# Patient Record
Sex: Female | Born: 1995 | Hispanic: Yes | Smoking: Never smoker
Health system: Southern US, Community
[De-identification: ages and names within clinical notes are randomized; demographics above are authoritative.]

## PROBLEM LIST (undated history)

## (undated) ENCOUNTER — Inpatient Hospital Stay (HOSPITAL_COMMUNITY): Payer: Self-pay

## (undated) DIAGNOSIS — B009 Herpesviral infection, unspecified: Secondary | ICD-10-CM

## (undated) DIAGNOSIS — N12 Tubulo-interstitial nephritis, not specified as acute or chronic: Secondary | ICD-10-CM

## (undated) DIAGNOSIS — K219 Gastro-esophageal reflux disease without esophagitis: Secondary | ICD-10-CM

## (undated) DIAGNOSIS — Z789 Other specified health status: Secondary | ICD-10-CM

## (undated) HISTORY — DX: Gastro-esophageal reflux disease without esophagitis: K21.9

---

## 2017-11-16 ENCOUNTER — Ambulatory Visit (INDEPENDENT_AMBULATORY_CARE_PROVIDER_SITE_OTHER): Payer: Self-pay | Admitting: Emergency Medicine

## 2017-11-16 ENCOUNTER — Encounter: Payer: Self-pay | Admitting: Emergency Medicine

## 2017-11-16 ENCOUNTER — Other Ambulatory Visit: Payer: Self-pay

## 2017-11-16 VITALS — BP 96/60 | HR 97 | Temp 99.5°F | Resp 16 | Ht 62.25 in | Wt 158.0 lb

## 2017-11-16 DIAGNOSIS — M545 Low back pain, unspecified: Secondary | ICD-10-CM

## 2017-11-16 DIAGNOSIS — R109 Unspecified abdominal pain: Secondary | ICD-10-CM | POA: Insufficient documentation

## 2017-11-16 LAB — POCT URINALYSIS DIP (MANUAL ENTRY)
BILIRUBIN UA: NEGATIVE
GLUCOSE UA: NEGATIVE mg/dL
Ketones, POC UA: NEGATIVE mg/dL
Leukocytes, UA: NEGATIVE
NITRITE UA: NEGATIVE
PH UA: 7 (ref 5.0–8.0)
Protein Ur, POC: NEGATIVE mg/dL
SPEC GRAV UA: 1.01 (ref 1.010–1.025)
UROBILINOGEN UA: 0.2 U/dL

## 2017-11-16 NOTE — Patient Instructions (Addendum)
   IF you received an x-ray today, you will receive an invoice from [facility]. Please contact [facility] at [phone] with questions or concerns regarding your invoice.   IF you received labwork today, you will receive an invoice from [facility]. Please contact [facility] at [phone] with questions or concerns regarding your invoice.   Our billing staff will not be able to assist you with questions regarding bills from these companies.  You will be contacted with the lab results as soon as they are available. The fastest way to get your results is to activate your My Chart account. Instructions are located on the last page of this paperwork. If you have not heard from us regarding the results in 2 weeks, please contact this office.     Dolor de espalda en adultos (Back Pain, Adult) El dolor de espalda es muy frecuente. A menudo mejora con el tiempo. La causa del dolor de espalda generalmente no es peligrosa. La mayora de las personas puede aprender a manejar el dolor de espalda por s mismas. CUIDADOS EN EL HOGAR Controle su dolor de espalda a fin de detectar algn cambio. Las siguientes indicaciones ayudarn a aliviar cualquier dolor que pueda sentir:  Mantngase activo. Comience con caminatas cortas sobre superficies planas si es posible. Trate de caminar un poco ms cada da.  Haga ejercicios con regularidad tal como le indic el mdico. El ejercicio ayuda a que su espalda se cure ms rpidamente. Tambin ayuda a prevenir futuras lesiones al mantener los msculos fuertes y flexibles.  No se siente, conduzca ni permanezca de pie durante ms de 30 minutos.  No permanezca en la cama. Si hace reposo ms de 1 a 2 das, puede demorar su recuperacin.  Sea cuidadoso al inclinarse o levantar un objeto. Use una tcnica apropiada para levantar peso: ? Flexione las rodillas. ? Mantenga el objeto cerca del cuerpo. ? No gire.  Duerma sobre un colchn firme. Recustese  sobre un costado y flexione las rodillas. Si se recuesta sobre la espalda, coloque una almohada debajo de las rodillas.  Tome los medicamentos solamente como se lo haya indicado el mdico.  Aplique hielo sobre la zona lesionada. ? Ponga el hielo en una bolsa plstica. ? Coloque una toalla entre la piel y la bolsa de hielo. ? Deje el hielo durante 20minutos, 2 a 3veces por da, durante los primeros 2 o 3das. Despus de eso, puede alternar entre compresas de hielo y calor.  Evite sentir ansiedad o estrs. Encuentre maneras efectivas de lidiar con el estrs, como hacer ejercicio.  Mantenga un peso saludable. El peso excesivo ejerce tensin sobre la espalda. SOLICITE AYUDA SI:  Siente dolor que no se alivia con reposo o medicamentos.  Siente cada vez ms dolor que se extiende a las piernas o los glteos.  El dolor no mejora en una semana.  Siente dolor por la noche.  Pierde peso.  Siente escalofros o fiebre. SOLICITE AYUDA DE INMEDIATO SI:  No puede controlar su materia fecal (heces) o el pis (orina).  Siente debilidad en las piernas o los brazos.  Siente prdida de la sensibilidad (adormecimiento) en las piernas o los brazos.  Tiene malestar estomacal (nuseas) o vomita.  Siente dolor de estmago (abdominal).  Siente que se desvanece (se desmaya). Esta informacin no tiene como fin reemplazar el consejo del mdico. Asegrese de hacerle al mdico cualquier pregunta que tenga. Document Released: 06/20/2011 Document Revised: 12/26/2014 Document Reviewed: 04/08/2014 Elsevier Interactive Patient Education  2018 Elsevier Inc.    Back Pain, Adult Back pain is very common. The pain often gets better over time. The cause of back pain is usually not dangerous. Most people can learn to manage their back pain on their own. Follow these instructions at home: Watch your back pain for any changes. The following actions may help to lessen any pain you are feeling:  Stay active. Start  with short walks on flat ground if you can. Try to walk farther each day.  Exercise regularly as told by your doctor. Exercise helps your back heal faster. It also helps avoid future injury by keeping your muscles strong and flexible.  Do not sit, drive, or stand in one place for more than 30 minutes.  Do not stay in bed. Resting more than 1-2 days can slow down your recovery.  Be careful when you bend or lift an object. Use good form when lifting: ? Bend at your knees. ? Keep the object close to your body. ? Do not twist.  Sleep on a firm mattress. Lie on your side, and bend your knees. If you lie on your back, put a pillow under your knees.  Take medicines only as told by your doctor.  Put ice on the injured area. ? Put ice in a plastic bag. ? Place a towel between your skin and the bag. ? Leave the ice on for 20 minutes, 2-3 times a day for the first 2-3 days. After that, you can switch between ice and heat packs.  Avoid feeling anxious or stressed. Find good ways to deal with stress, such as exercise.  Maintain a healthy weight. Extra weight puts stress on your back.  Contact a doctor if:  You have pain that does not go away with rest or medicine.  You have worsening pain that goes down into your legs or buttocks.  You have pain that does not get better in one week.  You have pain at night.  You lose weight.  You have a fever or chills. Get help right away if:  You cannot control when you poop (bowel movement) or pee (urinate).  Your arms or legs feel weak.  Your arms or legs lose feeling (numbness).  You feel sick to your stomach (nauseous) or throw up (vomit).  You have belly (abdominal) pain.  You feel like you may pass out (faint). This information is not intended to replace advice given to you by your health care provider. Make sure you discuss any questions you have with your health care provider. Document Released: 05/23/2008 Document Revised:  05/12/2016 Document Reviewed: 04/08/2014 Elsevier Interactive Patient Education  [employer]2018 Elsevier Inc.

## 2017-11-16 NOTE — Progress Notes (Signed)
Kathleen Frey 21 y.o.   Chief Complaint  Patient presents with  . Back Pain    lower area with abdominal pain x 4 days    HISTORY OF PRESENT ILLNESS: This is a 21 y.o. female complaining of pain to right flank area x 4 days; urine smells; denies dysuria but has frequency; years ago had similar symptoms and diagnosed with kidney infection. Denies fever, chills, n/v, or hematuria.  HPI   Prior to Admission medications   Medication Sig Start Date End Date Taking? Authorizing Provider  fluconazole (DIFLUCAN) 150 MG tablet Take 150 mg by mouth daily.   Yes [provider]  metroNIDAZOLE (FLAGYL) 500 MG tablet Take 500 mg by mouth 2 (two) times daily.   Yes [provider]    No Known Allergies  There are no active problems to display for this patient.   No past medical history on file.    Social History   Socioeconomic History  . Marital status: Married    Spouse name: Not on file  . Number of children: Not on file  . Years of education: Not on file  . Highest education level: Not on file  Social Needs  . Financial resource strain: Not on file  . Food insecurity - worry: Not on file  . Food insecurity - inability: Not on file  . Transportation needs - medical: Not on file  . Transportation needs - non-medical: Not on file  Occupational History  . Not on file  Tobacco Use  . Smoking status: Never Smoker  . Smokeless tobacco: Never Used  Substance and Sexual Activity  . Alcohol use: No    Frequency: Never  . Drug use: No  . Sexual activity: Not on file  Other Topics Concern  . Not on file  Social History Narrative  . Not on file    No family history on file.   Review of Systems  Constitutional: Negative.  Negative for chills.  HENT: Negative.   Eyes: Negative.   Respiratory: Negative.  Negative for cough and shortness of breath.   Cardiovascular: Negative for chest pain and palpitations.  Gastrointestinal: Positive for  abdominal pain. Negative for blood in stool, constipation, diarrhea, nausea and vomiting.  Genitourinary: Positive for flank pain and frequency. Negative for dysuria and hematuria.  Musculoskeletal: Positive for back pain.  Skin: Negative for rash.  Neurological: Negative for dizziness and headaches.  Endo/Heme/Allergies: Negative.   All other systems reviewed and are negative.    Vitals:   11/16/17 1633  BP: 96/60  Pulse: 97  Resp: 16  Temp: 99.5 F (37.5 C)  SpO2: 97%   Results for orders placed or performed in visit on 11/16/17 (from the past 24 hour(s))  POCT urinalysis dipstick     Status: Abnormal   Collection Time: 11/16/17  5:49 PM  Result Value Ref Range   Color, UA yellow yellow   Clarity, UA clear clear   Glucose, UA negative negative mg/dL   Bilirubin, UA negative negative   Ketones, POC UA negative negative mg/dL   Spec Grav, UA [id number] - 1.025   Blood, UA small (A) negative   pH, UA 7.0 5.0 - 8.0   Protein Ur, POC negative negative mg/dL   Urobilinogen, UA 0.2 0.2 or 1.0 E.U./dL   Nitrite, UA Negative Negative   Leukocytes, UA Negative Negative     Physical Exam  Constitutional: She is oriented to person, place, and time. She appears well-developed and well-nourished.  HENT:  Head: Normocephalic and atraumatic.  Eyes: Conjunctivae and EOM are normal. Pupils are equal, round, and reactive to light.  Neck: Normal range of motion. Neck supple.  Cardiovascular: Normal rate, regular rhythm and normal heart sounds.  Pulmonary/Chest: Effort normal and breath sounds normal.  Abdominal: Soft. Bowel sounds are normal. She exhibits no distension. There is no tenderness. There is no CVA tenderness.  Neurological: She is alert and oriented to person, place, and time. No sensory deficit. She exhibits normal muscle tone.  Skin: Skin is warm and dry. Capillary refill takes less than 2 seconds. No rash noted.  Psychiatric: She has a normal mood and affect. Her  behavior is normal.  Vitals reviewed.    ASSESSMENT & PLAN: Aylissa was seen today for back pain.  Diagnoses and all orders for this visit:  Acute right-sided low back pain without sciatica -     POCT urinalysis dipstick -     Urine Culture  Flank pain    Patient Instructions       IF you received an x-ray today, you will receive an invoice from [facility]. Please contact [facility] at [phone] with questions or concerns regarding your invoice.   IF you received labwork today, you will receive an invoice from [facility]. Please contact [facility] at [phone] with questions or concerns regarding your invoice.   Our billing staff will not be able to assist you with questions regarding bills from these companies.  You will be contacted with the lab results as soon as they are available. The fastest way to get your results is to activate your My Chart account. Instructions are located on the last page of this paperwork. If you have not heard from [place] regarding the results in 2 weeks, please contact this office.     Dolor de espalda en adultos (Back Pain, Adult) El dolor de espalda es muy frecuente. A menudo mejora con el tiempo. La causa del dolor de espalda generalmente no es peligrosa. La [employer] de las personas puede aprender a Runner, broadcasting/film/video de espalda por s mismas. CUIDADOS EN EL HOGAR Controle su dolor de espalda a fin de Public house manager cambio. Las siguientes indicaciones ayudarn a Psychologist, clinical que pueda sentir:  Materials engineer. Comience con caminatas cortas sobre superficies planas si es posible. Trate de caminar un poco ms cada da.  Haga ejercicios con regularidad tal como le indic el mdico. El ejercicio ayuda a que su espalda se cure ms rpidamente. Tambin ayuda a prevenir futuras lesiones al [employer] fuertes y flexibles.  No se siente, conduzca ni permanezca de pie durante ms de 30 minutos.  No permanezca  en la cama. Si hace reposo ms de 1 a 2 das, puede demorar su recuperacin.  Sea cuidadoso al inclinarse o levantar un objeto. Use una tcnica apropiada para levantar peso: ? Flexione las rodillas. ? Mantenga el objeto cerca del cuerpo. ? No gire.  Duerma sobre un [employer]. Recustese sobre un costado y flexione las rodillas. Si se recuesta [employer], coloque una almohada debajo de las rodillas.  Tome los medicamentos solamente como se lo haya indicado el mdico.  Aplique hielo sobre la zona lesionada. ? Ponga el hielo en una bolsa plstica. ? Coloque una [employer] piel y la bolsa de hielo. ? Deje el hielo durante , 2 a 3veces por da, durante los primeros 2 o 3das. Despus de eso, puede alternar entre compresas de hielo y Airline pilot.  Evite sentir ansiedad  o estrs. Encuentre maneras efectivas de lidiar con el estrs, Surveyor, mining ejercicio.  Mantenga un peso saludable. El peso excesivo ejerce tensin sobre la espalda. SOLICITE AYUDA SI:  Siente dolor que no se alivia con reposo o medicamentos.  Siente cada vez ms dolor que se extiende a las piernas o los glteos.  El dolor no mejora en una semana.  Siente dolor por la noche.  Pierde peso.  Siente escalofros o fiebre. SOLICITE AYUDA DE INMEDIATO SI:  No puede controlar su materia fecal (heces) o el pis (orina).  Siente debilidad en las piernas o los brazos.  Siente prdida de la sensibilidad (adormecimiento) en las piernas o los brazos.  Tiene malestar estomacal (nuseas) o vomita.  Siente dolor de estmago (abdominal).  Siente que se desvanece (se desmaya). Esta informacin no tiene Theme park manager el consejo del mdico. Asegrese de hacerle al mdico cualquier pregunta que tenga. Document Released: 06/20/2011 Document Revised: 12/26/2014 Document Reviewed: 04/08/2014 Elsevier Interactive Patient Education  2018 Elsevier Inc.  Back Pain, Adult Back pain is very common. The pain often  gets better over time. The cause of back pain is usually not dangerous. Most people can learn to manage their back pain on their own. Follow these instructions at home: Watch your back pain for any changes. The following actions may help to lessen any pain you are feeling:  Stay active. Start with short walks on flat ground if you can. Try to walk farther each day.  Exercise regularly as told by your doctor. Exercise helps your back heal faster. It also helps avoid future injury by keeping your muscles strong and flexible.  Do not sit, drive, or stand in one place for more than 30 minutes.  Do not stay in bed. Resting more than 1-2 days can slow down your recovery.  Be careful when you bend or lift an object. Use good form when lifting: ? Bend at your knees. ? Keep the object close to your body. ? Do not twist.  Sleep on a firm mattress. Lie on your side, and bend your knees. If you lie on your back, put a pillow under your knees.  Take medicines only as told by your doctor.  Put ice on the injured area. ? Put ice in a plastic bag. ? Place a towel between your skin and the bag. ? Leave the ice on for 20 minutes, 2-3 times a day for the first 2-3 days. After that, you can switch between ice and heat packs.  Avoid feeling anxious or stressed. Find good ways to deal with stress, such as exercise.  Maintain a healthy weight. Extra weight puts stress on your back.  Contact a doctor if:  You have pain that does not go away with rest or medicine.  You have worsening pain that goes down into your legs or buttocks.  You have pain that does not get better in one week.  You have pain at night.  You lose weight.  You have a fever or chills. Get help right away if:  You cannot control when you poop (bowel movement) or pee (urinate).  Your arms or legs feel weak.  Your arms or legs lose feeling (numbness).  You feel sick to your stomach (nauseous) or throw up (vomit).  You have  belly (abdominal) pain.  You feel like you may pass out (faint). This information is not intended to replace advice given to you by your health care provider. Make sure you discuss any questions  you have with your health care provider. Document Released: 05/23/2008 Document Revised: 05/12/2016 Document Reviewed: 04/08/2014 Elsevier Interactive Patient Education  2018 Elsevier Inc.      Edwina BarthMiguel Kate Larock, MD [facility]

## 2017-11-17 LAB — URINE CULTURE

## 2017-11-19 ENCOUNTER — Emergency Department (HOSPITAL_COMMUNITY)
Admission: EM | Admit: 2017-11-19 | Discharge: 2017-11-19 | Disposition: A | Discharge status: Home / Self Care | Payer: Self-pay | Attending: Emergency Medicine | Admitting: Emergency Medicine

## 2017-11-19 ENCOUNTER — Emergency Department (HOSPITAL_COMMUNITY): Payer: Self-pay

## 2017-11-19 ENCOUNTER — Other Ambulatory Visit: Payer: Self-pay

## 2017-11-19 ENCOUNTER — Encounter (HOSPITAL_COMMUNITY): Payer: Self-pay | Admitting: Emergency Medicine

## 2017-11-19 DIAGNOSIS — R1031 Right lower quadrant pain: Secondary | ICD-10-CM | POA: Insufficient documentation

## 2017-11-19 DIAGNOSIS — R1011 Right upper quadrant pain: Secondary | ICD-10-CM | POA: Insufficient documentation

## 2017-11-19 DIAGNOSIS — M791 Myalgia, unspecified site: Secondary | ICD-10-CM | POA: Insufficient documentation

## 2017-11-19 DIAGNOSIS — M545 Low back pain: Secondary | ICD-10-CM | POA: Insufficient documentation

## 2017-11-19 DIAGNOSIS — R10813 Right lower quadrant abdominal tenderness: Secondary | ICD-10-CM | POA: Insufficient documentation

## 2017-11-19 DIAGNOSIS — R110 Nausea: Secondary | ICD-10-CM | POA: Insufficient documentation

## 2017-11-19 DIAGNOSIS — M542 Cervicalgia: Secondary | ICD-10-CM | POA: Insufficient documentation

## 2017-11-19 DIAGNOSIS — N12 Tubulo-interstitial nephritis, not specified as acute or chronic: Secondary | ICD-10-CM | POA: Insufficient documentation

## 2017-11-19 DIAGNOSIS — R51 Headache: Secondary | ICD-10-CM | POA: Insufficient documentation

## 2017-11-19 DIAGNOSIS — R10811 Right upper quadrant abdominal tenderness: Secondary | ICD-10-CM | POA: Insufficient documentation

## 2017-11-19 DIAGNOSIS — Z79899 Other long term (current) drug therapy: Secondary | ICD-10-CM | POA: Insufficient documentation

## 2017-11-19 LAB — URINALYSIS, ROUTINE W REFLEX MICROSCOPIC
Bilirubin Urine: NEGATIVE
Glucose, UA: NEGATIVE mg/dL
Ketones, ur: 5 mg/dL — AB
Nitrite: NEGATIVE
PH: 9 — AB (ref 5.0–8.0)
Protein, ur: NEGATIVE mg/dL
Specific Gravity, Urine: 1.009 (ref 1.005–1.030)

## 2017-11-19 LAB — COMPREHENSIVE METABOLIC PANEL
ALK PHOS: 77 U/L (ref 38–126)
ALT: 14 U/L (ref 14–54)
ANION GAP: 9 (ref 5–15)
AST: 17 U/L (ref 15–41)
Albumin: 3.5 g/dL (ref 3.5–5.0)
BILIRUBIN TOTAL: 0.6 mg/dL (ref 0.3–1.2)
BUN: 6 mg/dL (ref 6–20)
CALCIUM: 9 mg/dL (ref 8.9–10.3)
CO2: 24 mmol/L (ref 22–32)
Chloride: 105 mmol/L (ref 101–111)
Creatinine, Ser: 0.66 mg/dL (ref 0.44–1.00)
GLUCOSE: 92 mg/dL (ref 65–99)
POTASSIUM: 3.9 mmol/L (ref 3.5–5.1)
Sodium: 138 mmol/L (ref 135–145)
TOTAL PROTEIN: 7.5 g/dL (ref 6.5–8.1)

## 2017-11-19 LAB — CBC WITH DIFFERENTIAL/PLATELET
BASOS ABS: 0 10*3/uL (ref 0.0–0.1)
BASOS PCT: 0 %
EOS ABS: 0 10*3/uL (ref 0.0–0.7)
Eosinophils Relative: 0 %
HEMATOCRIT: 38.4 % (ref 36.0–46.0)
HEMOGLOBIN: 12.8 g/dL (ref 12.0–15.0)
Lymphocytes Relative: 7 %
Lymphs Abs: 1.3 10*3/uL (ref 0.7–4.0)
MCH: 28.9 pg (ref 26.0–34.0)
MCHC: 33.3 g/dL (ref 30.0–36.0)
MCV: 86.7 fL (ref 78.0–100.0)
MONOS PCT: 3 %
Monocytes Absolute: 0.6 10*3/uL (ref 0.1–1.0)
NEUTROS ABS: 15.7 10*3/uL — AB (ref 1.7–7.7)
NEUTROS PCT: 90 %
Platelets: 234 10*3/uL (ref 150–400)
RBC: 4.43 MIL/uL (ref 3.87–5.11)
RDW: 11.7 % (ref 11.5–15.5)
WBC: 17.6 10*3/uL — ABNORMAL HIGH (ref 4.0–10.5)

## 2017-11-19 LAB — PROTIME-INR
INR: 1.15
PROTHROMBIN TIME: 14.6 s (ref 11.4–15.2)

## 2017-11-19 LAB — I-STAT BETA HCG BLOOD, ED (MC, WL, AP ONLY): I-stat hCG, quantitative: <5 m[IU]/mL (ref <5)

## 2017-11-19 LAB — I-STAT CG4 LACTIC ACID, ED: LACTIC ACID, VENOUS: 1.06 mmol/L (ref 0.5–1.9)

## 2017-11-19 MED ORDER — TRAMADOL HCL 50 MG PO TABS: 50.0000 mg | ORAL_TABLET | Freq: Four times a day (QID) | ORAL | 0 refills | Status: DC | PRN

## 2017-11-19 MED ORDER — ONDANSETRON 8 MG PO TBDP: 8.0000 mg | ORAL_TABLET | Freq: Three times a day (TID) | ORAL | 0 refills | Status: DC | PRN

## 2017-11-19 MED ORDER — ACETAMINOPHEN 325 MG PO TABS
650.0000 mg | ORAL_TABLET | Freq: Once | ORAL | Status: AC | PRN
  Administered 2017-11-19: 650 mg via ORAL
  Filled 2017-11-19: qty 2

## 2017-11-19 MED ORDER — DEXTROSE 5 % IV SOLN
1.0000 g | Freq: Once | INTRAVENOUS | Status: AC
  Administered 2017-11-19: 1 g via INTRAVENOUS
  Filled 2017-11-19: qty 10

## 2017-11-19 MED ORDER — CEPHALEXIN 500 MG PO CAPS: 500.0000 mg | ORAL_CAPSULE | Freq: Four times a day (QID) | ORAL | 0 refills | Status: AC

## 2017-11-19 MED ORDER — IOPAMIDOL (ISOVUE-300) INJECTION 61%
INTRAVENOUS | Status: AC
  Administered 2017-11-19: 100 mL
  Filled 2017-11-19: qty 100

## 2017-11-19 MED ORDER — SODIUM CHLORIDE 0.9 % IV BOLUS (SEPSIS)
1000.0000 mL | Freq: Once | INTRAVENOUS | Status: AC
  Administered 2017-11-19: 1000 mL via INTRAVENOUS

## 2017-11-19 MED ORDER — IBUPROFEN 600 MG PO TABS: 600.0000 mg | ORAL_TABLET | Freq: Four times a day (QID) | ORAL | 0 refills | Status: DC | PRN

## 2017-11-19 MED ORDER — ONDANSETRON HCL 4 MG/2ML IJ SOLN
4.0000 mg | Freq: Once | INTRAMUSCULAR | Status: AC
  Administered 2017-11-19: 4 mg via INTRAVENOUS
  Filled 2017-11-19: qty 2

## 2017-11-19 MED ORDER — MORPHINE SULFATE (PF) 4 MG/ML IV SOLN
4.0000 mg | Freq: Once | INTRAVENOUS | Status: AC
  Administered 2017-11-19: 4 mg via INTRAVENOUS
  Filled 2017-11-19: qty 1

## 2017-11-19 NOTE — Discharge Instructions (Signed)
You have evidence of a kidney infection called pyelonephritis. Hydration: Please be sure to drink plenty of water to stay well hydrated.  This includes having a goal of about half a liter of water an hour. Antiinflammatory medications: Take 600 mg of ibuprofen every 6 hours or 440 mg (over the counter dose) to 500 mg (prescription dose) of naproxen every 12 hours for the next 3 days. After this time, these medications may be used as needed for pain. Take these medications with food to avoid upset stomach. Choose only one of these medications, do not take them together. Tylenol: Should you continue to have additional pain while taking the ibuprofen or naproxen, you may add in tylenol as needed. Your daily total maximum amount of tylenol from all sources should be limited to 4000mg /day for persons without liver problems, or 2000mg /day for those with liver problems.  Tramadol: Tramadol may be used for more severe pain. Do not drive or perform other dangerous activities while taking the Tramadol. Fever: Tylenol, ibuprofen, or naproxen should be used for fever as well as pain.  Zofran: May use Zofran as needed for nausea or vomiting.  Antibiotics: Please take all of your antibiotics until finished!   You may develop abdominal discomfort or diarrhea from the antibiotic.  You may help offset this with probiotics which you can buy or get in yogurt. Do not eat or take the probiotics until 2 hours after your antibiotic.   Follow-up: Follow-up with a primary care provider on this matter. Return: Return to the ED for worsening symptoms including significantly worsened pain, persistent vomiting, or any other major concerns.

## 2017-11-19 NOTE — ED Provider Notes (Signed)
[facility] EMERGENCY DEPARTMENT Provider Note   CSN: [id number] Arrival date & time: 11/19/17  0620     History   Chief Complaint Chief Complaint  Patient presents with  . Fever  . Generalized Body Aches    HPI Kathleen Frey is a 21 y.o. female.  The history is provided by the patient. A language interpreter was used ([place]).     Kathleen Frey is a 21 y.o. female, with a history of sciatica, presenting to the ED with right lower back and abdominal pain beginning 3 days ago. Patient states pain started in the right lower back, she indicates at a level even with iliac crest.  The pain has since migrated to the right side of the abdomen with most of the pain settling in the right lower quadrant.  She gives a vague description of the pain, rates it previously at 9/10, now minor following Tylenol.  Accompanied by nausea, fever, headache, nonbloody diarrhea, body aches, and neck pain. Patient indicates the neck pain is bilateral in the musculature, minor, beginning yesterday.  Headache is global and minor.  Intermittent vaginal bleeding consistent with the timing of her menstrual cycle beginning this week. Denies IV drug use or history of HIV. Denies vomiting, rash, dizziness, neuro deficits, neck stiffness, cough, chest pain, shortness of breath, urinary complaints, sore throat, or any other complaints.    History reviewed. No pertinent past medical history.  Patient Active Problem List   Diagnosis Date Noted  . Acute right-sided low back pain without sciatica 11/16/2017  . Flank pain 11/16/2017    Past Surgical History:  Procedure Laterality Date  . CESAREAN SECTION      OB History    No data available       Home Medications    Prior to Admission medications   Medication Sig Start Date End Date Taking? Authorizing Provider  cephALEXin (KEFLEX) 500 MG capsule Take 1 capsule (500 mg total) by mouth 4 (four) times daily for 10  days. 11/19/17 11/29/17  Deandre Brannan C, PA-C  fluconazole (DIFLUCAN) 150 MG tablet Take 150 mg by mouth daily.    [provider]  ibuprofen (ADVIL,MOTRIN) 600 MG tablet Take 1 tablet (600 mg total) by mouth every 6 (six) hours as needed. 11/19/17   Naveed Humphres C, PA-C  metroNIDAZOLE (FLAGYL) 500 MG tablet Take 500 mg by mouth 2 (two) times daily.    [provider]  ondansetron (ZOFRAN ODT) 8 MG disintegrating tablet Take 1 tablet (8 mg total) by mouth every 8 (eight) hours as needed for nausea or vomiting. 11/19/17   Sneijder Bernards C, PA-C  traMADol (ULTRAM) 50 MG tablet Take 1 tablet (50 mg total) by mouth every 6 (six) hours as needed. 11/19/17   Anselm Pancoast, PA-C    Family History No family history on file.  Social History Social History   Tobacco Use  . Smoking status: Never Smoker  . Smokeless tobacco: Never Used  Substance Use Topics  . Alcohol use: No    Frequency: Never  . Drug use: No     Allergies   Patient has no known allergies.   Review of Systems Review of Systems  Constitutional: Positive for fever.  HENT: Negative for sore throat.   Respiratory: Negative for cough and shortness of breath.   Cardiovascular: Negative for chest pain and leg swelling.  Gastrointestinal: Positive for abdominal pain, diarrhea and nausea. Negative for vomiting.  Genitourinary: Positive for flank pain. Negative for dysuria,  hematuria and vaginal discharge.  Musculoskeletal: Positive for back pain and neck pain.  Skin: Negative for rash.  Neurological: Positive for headaches. Negative for weakness and numbness.  All other systems reviewed and are negative.    Physical Exam Updated Vital Signs BP 130/80 (BP Location: Right Arm)   Pulse (!) 134   Temp (!) 102.9 F (39.4 C) (Oral)   Resp (!) 22   Ht 5\' 2"  (1.575 m)   Wt 71.7 kg (158 lb)   LMP 11/15/2017   SpO2 100%   BMI 28.90 kg/m   Physical Exam  Constitutional: She is oriented to person, place, and time. She  appears well-developed and well-nourished. No distress.  HENT:  Head: Normocephalic and atraumatic.  Mouth/Throat: Oropharynx is clear and moist.  Eyes: Conjunctivae and EOM are normal. Pupils are equal, round, and reactive to light.  Neck: Normal range of motion and full passive range of motion without pain. Neck supple. No Brudzinski's sign and no Kernig's sign noted.  Cardiovascular: Normal rate, regular rhythm, normal heart sounds and intact distal pulses.  Pulmonary/Chest: Effort normal and breath sounds normal. No respiratory distress.  Abdominal: Soft. There is tenderness in the right upper quadrant and right lower quadrant. There is no guarding.  Tenderness to the right flank.  Musculoskeletal: She exhibits no edema.       Arms: Mild tenderness to the bilateral cervical musculature.  Normal motor function intact in all extremities and spine. No midline spinal tenderness.   Lymphadenopathy:    She has no cervical adenopathy.  Neurological: She is alert and oriented to person, place, and time.  No sensory deficits.  No noted speech deficits. No aphasia. Patient handles oral secretions without difficulty. No noted swallowing defects.  Equal grip strength bilaterally. Strength 5/5 in the upper extremities. Strength 5/5 with flexion and extension of the hips, knees, and ankles bilaterally.  Negative Romberg. No gait disturbance.  Coordination intact including heel to shin and finger to nose.  Cranial nerves III-XII grossly intact.  No facial droop.   Skin: Skin is warm and dry. Capillary refill takes less than 2 seconds. She is not diaphoretic.  Psychiatric: She has a normal mood and affect. Her behavior is normal.  Nursing note and vitals reviewed.    ED Treatments / Results  Labs (all labs ordered are listed, but only abnormal results are displayed) Labs Reviewed  CBC WITH DIFFERENTIAL/PLATELET - Abnormal; Notable for the following components:      Result Value   WBC 17.6  (*)    Neutro Abs 15.7 (*)    All other components within normal limits  URINALYSIS, ROUTINE W REFLEX MICROSCOPIC - Abnormal; Notable for the following components:   APPearance HAZY (*)    pH 9.0 (*)    Hgb urine dipstick MODERATE (*)    Ketones, ur 5 (*)    Leukocytes, UA LARGE (*)    Bacteria, UA RARE (*)    Squamous Epithelial / LPF 0-5 (*)    All other components within normal limits  CULTURE, BLOOD (ROUTINE X 2)  CULTURE, BLOOD (ROUTINE X 2)  URINE CULTURE  COMPREHENSIVE METABOLIC PANEL  PROTIME-INR  I-STAT CG4 LACTIC ACID, ED  I-STAT BETA HCG BLOOD, ED ([facility], [facility], [facility] ONLY)    EKG  EKG Interpretation None       Radiology Dg Chest 2 View  Result Date: 11/19/2017 CLINICAL DATA:  Fever and body aches. EXAM: CHEST  2 VIEW COMPARISON:  None. FINDINGS: The cardiomediastinal contours are  normal. Low lung volumes with bronchovascular crowding. Calcified granuloma in the left lower lung zone. No consolidation, pleural effusion, or pneumothorax. No acute osseous abnormalities are seen. IMPRESSION: Low lung volumes with bronchovascular crowding. No evidence of pneumonia. Electronically Signed   By: Rubye OaksMelanie  Ehinger M.D.   On: 11/19/2017 06:59   Ct Abdomen Pelvis W Contrast  Result Date: 11/19/2017 CLINICAL DATA:  Right lower quadrant pain EXAM: CT ABDOMEN AND PELVIS WITH CONTRAST TECHNIQUE: Multidetector CT imaging of the abdomen and pelvis was performed using the standard protocol following bolus administration of intravenous contrast. CONTRAST:  100mL ISOVUE-300 IOPAMIDOL (ISOVUE-300) INJECTION 61% COMPARISON:  None FINDINGS: Lower chest: Dependent atelectasis in the lung bases. Calcified granuloma in the left base. Heart is normal size. No effusions. Hepatobiliary: No focal hepatic abnormality. Gallbladder unremarkable. Pancreas: No focal abnormality or ductal dilatation. Spleen: No focal abnormality.  Normal size. Adrenals/Urinary Tract: Areas of decreased enhancement throughout the  right kidney compatible with pyelonephritis, best seen in the mid and lower poles. No hydronephrosis. Left kidney, adrenal glands and urinary bladder unremarkable. Stomach/Bowel: Normal retrocecal appendix. Stomach, large and small bowel grossly unremarkable. Vascular/Lymphatic: No evidence of aneurysm or adenopathy. Reproductive: 6.5 cm cyst in the right adnexa. Uterus and left adnexa unremarkable. Other: No free fluid or free air. Musculoskeletal: No acute bony abnormality. IMPRESSION: Areas of decreased enhancement in the mid and lower poles of the right kidney compatible with pyelonephritis. Normal retrocecal appendix. 6.5 cm right ovarian cyst. Electronically Signed   By: Charlett NoseKevin  Dover M.D.   On: 11/19/2017 09:30    Procedures Procedures (including critical care time)  Medications Ordered in ED Medications  morphine 4 MG/ML injection 4 mg (not administered)  ondansetron (ZOFRAN) injection 4 mg (not administered)  acetaminophen (TYLENOL) tablet 650 mg (650 mg Oral Given 11/19/17 0637)  sodium chloride 0.9 % bolus 1,000 mL (1,000 mLs Intravenous New Bag/Given 11/19/17 0801)  cefTRIAXone (ROCEPHIN) 1 g in dextrose 5 % 50 mL IVPB (0 g Intravenous Stopped 11/19/17 0933)  iopamidol (ISOVUE-300) 61 % injection (100 mLs  Contrast Given 11/19/17 0852)     Initial Impression / Assessment and Plan / ED Course  I have reviewed the triage vital signs and the nursing notes.  Pertinent labs & imaging results that were available during my care of the patient were reviewed by me and considered in my medical decision making (see chart for details).  Clinical Course as of Nov 19 1002  Sun Nov 19, 2017  [id number] Declines pain management at this time.  [SJ]  [id number] Discussed lab and imaging results with the patient.  Patient states that she feels much better.  Head and neck pain have resolved.  Abdominal and flank pain are now 6/10. Shared decision making was used regarding admission versus discharge.  Patient  listened to the pros and cons of both options and opted for discharge.  [SJ]    Clinical Course User Index [SJ] Deberah Adolf C, PA-C    Patient presents with right lower back and abdominal pain.  Suspect pyelonephritis based on patient's symptoms, physical exam findings, and UA results.  Pyelonephritis diagnosis supported with CT results.  Normal appendix.  Through shared decision making, patient opted for discharge. Tolerating PO. Pain well controlled. PCP follow up.  Resources given. The patient was given instructions for home care as well as return precautions. Patient voices understanding of these instructions, accepts the plan, and is comfortable with discharge.   Findings and plan of care discussed with Kristine RoyalPeter Messick, MD.  Vitals:   11/19/17 0623 11/19/17 0715  BP: 130/80 112/81  Pulse: (!) 134 (!) 122  Resp: (!) 22 (!) 25  Temp: (!) 102.9 F (39.4 C) (!) 102.8 F (39.3 C)  TempSrc: Oral Oral  SpO2: 100% 99%  Weight: 71.7 kg (158 lb)   Height: 5\' 2"  (1.575 m)    Vitals:   11/19/17 0715 11/19/17 0800 11/19/17 0830 11/19/17 0932  BP: 112/81 (!) 107/58 (!) 100/55 (!) 100/59  Pulse: (!) 122 (!) 106 93 98  Resp: (!) 25 (!) 24 18 12   Temp: (!) 102.8 F (39.3 C)   99.8 F (37.7 C)  TempSrc: Oral   Oral  SpO2: 99% 98% 98% 99%  Weight:      Height:         Final Clinical Impressions(s) / ED Diagnoses   Final diagnoses:  Pyelonephritis    ED Discharge Orders        Ordered    ibuprofen (ADVIL,MOTRIN) 600 MG tablet  Every 6 hours PRN     11/19/17 1003    traMADol (ULTRAM) 50 MG tablet  Every 6 hours PRN     11/19/17 1003    cephALEXin (KEFLEX) 500 MG capsule  4 times daily     11/19/17 1003    ondansetron (ZOFRAN ODT) 8 MG disintegrating tablet  Every 8 hours PRN     11/19/17 1003       Anselm PancoastJoy, Amira Podolak C, PA-C 11/19/17 1004    Rancour, Jeannett SeniorStephen, MD 11/21/17 [phone]

## 2017-11-19 NOTE — ED Notes (Signed)
Patient transported to X-ray 

## 2017-11-19 NOTE — ED Triage Notes (Signed)
Reports fever and generalized body aches since Thursday.  Unsure of how high.  Last dose of tylenol was last night at 11pm.  Temp 102.9 in triage.  Also noted to be tachycardic.

## 2017-11-19 NOTE — ED Notes (Signed)
ED Provider at bedside. 

## 2017-11-20 ENCOUNTER — Other Ambulatory Visit: Payer: Self-pay

## 2017-11-20 ENCOUNTER — Emergency Department (HOSPITAL_COMMUNITY)
Admission: EM | Admit: 2017-11-20 | Discharge: 2017-11-20 | Disposition: A | Discharge status: Home / Self Care | Payer: Self-pay | Attending: Emergency Medicine | Admitting: Emergency Medicine

## 2017-11-20 DIAGNOSIS — R112 Nausea with vomiting, unspecified: Secondary | ICD-10-CM | POA: Insufficient documentation

## 2017-11-20 DIAGNOSIS — N12 Tubulo-interstitial nephritis, not specified as acute or chronic: Secondary | ICD-10-CM

## 2017-11-20 DIAGNOSIS — N10 Acute tubulo-interstitial nephritis: Secondary | ICD-10-CM | POA: Insufficient documentation

## 2017-11-20 DIAGNOSIS — Z3202 Encounter for pregnancy test, result negative: Secondary | ICD-10-CM | POA: Insufficient documentation

## 2017-11-20 LAB — COMPREHENSIVE METABOLIC PANEL
ALT: 12 U/L — AB (ref 14–54)
AST: 14 U/L — ABNORMAL LOW (ref 15–41)
Albumin: 3 g/dL — ABNORMAL LOW (ref 3.5–5.0)
Alkaline Phosphatase: 65 U/L (ref 38–126)
Anion gap: 7 (ref 5–15)
BUN: 6 mg/dL (ref 6–20)
CHLORIDE: 108 mmol/L (ref 101–111)
CO2: 21 mmol/L — AB (ref 22–32)
CREATININE: 0.68 mg/dL (ref 0.44–1.00)
Calcium: 8.7 mg/dL — ABNORMAL LOW (ref 8.9–10.3)
GFR calc non Af Amer: >60 mL/min (ref >60)
Glucose, Bld: 114 mg/dL — ABNORMAL HIGH (ref 65–99)
POTASSIUM: 3.7 mmol/L (ref 3.5–5.1)
SODIUM: 136 mmol/L (ref 135–145)
Total Bilirubin: 0.4 mg/dL (ref 0.3–1.2)
Total Protein: 6.8 g/dL (ref 6.5–8.1)

## 2017-11-20 LAB — CBC
HEMATOCRIT: 33.7 % — AB (ref 36.0–46.0)
HEMOGLOBIN: 11.2 g/dL — AB (ref 12.0–15.0)
MCH: 28.8 pg (ref 26.0–34.0)
MCHC: 33.2 g/dL (ref 30.0–36.0)
MCV: 86.6 fL (ref 78.0–100.0)
PLATELETS: 261 10*3/uL (ref 150–400)
RBC: 3.89 MIL/uL (ref 3.87–5.11)
RDW: 11.9 % (ref 11.5–15.5)
WBC: 20.1 10*3/uL — ABNORMAL HIGH (ref 4.0–10.5)

## 2017-11-20 LAB — URINALYSIS, MICROSCOPIC (REFLEX)

## 2017-11-20 LAB — URINALYSIS, ROUTINE W REFLEX MICROSCOPIC
Bilirubin Urine: NEGATIVE
GLUCOSE, UA: NEGATIVE mg/dL
Ketones, ur: 15 mg/dL — AB
Nitrite: NEGATIVE
PH: 6 (ref 5.0–8.0)
Protein, ur: NEGATIVE mg/dL
SPECIFIC GRAVITY, URINE: 1.01 (ref 1.005–1.030)

## 2017-11-20 LAB — I-STAT BETA HCG BLOOD, ED (MC, WL, AP ONLY): I-stat hCG, quantitative: 11.9 m[IU]/mL — ABNORMAL HIGH (ref <5)

## 2017-11-20 LAB — LIPASE, BLOOD: LIPASE: 18 U/L (ref 11–51)

## 2017-11-20 LAB — PREGNANCY, URINE: PREG TEST UR: NEGATIVE

## 2017-11-20 NOTE — ED Triage Notes (Signed)
The pt is c/o  epigasrtric pain with vomiting.  She was seen here yesterday  She feels like she cannot breathe at times.  She has gotten her rxs filled   But she she is c/o vomiting but the zofran in the bottle doesn l lmp now

## 2017-11-20 NOTE — Discharge Instructions (Signed)
Continue Keflex, Zofran, and Ibuprofen Drink plenty of fluids Return if worsening

## 2017-11-20 NOTE — ED Provider Notes (Signed)
[facility] Provider Note   CSN: [id number] Arrival date & time: 11/20/17  0030     History   Chief Complaint Chief Complaint  Patient presents with  . Abdominal Pain    HPI Wyline MoodBrenda Frey is a 21 y.o. female who presents with vomiting.  Patient states that she was diagnosed with a kidney infection yesterday.  She was discharged with Keflex, Zofran, ibuprofen, tramadol.  She has been taking this medicine however after she took tramadol she felt fatigued, like she could not breathe, and was having hallucinations.  She took Zofran for the nausea which improved her symptoms.  She has been able to take her antibiotic.  She states that she still has a headache and right flank pain however overall feels improved.  She is still having urinary symptoms with hematuria but is also on her period.  She had 2 episodes of vomiting overnight but states she has been able to take her antibiotic this morning and drink water without vomiting.  HPI  No past medical history on file.  Patient Active Problem List   Diagnosis Date Noted  . Acute right-sided low back pain without sciatica 11/16/2017  . Flank pain 11/16/2017    Past Surgical History:  Procedure Laterality Date  . CESAREAN SECTION      OB History    No data available       Home Medications    Prior to Admission medications   Medication Sig Start Date End Date Taking? Authorizing Provider  cephALEXin (KEFLEX) 500 MG capsule Take 1 capsule (500 mg total) by mouth 4 (four) times daily for 10 days. 11/19/17 11/29/17 Yes Joy, Shawn C, PA-C  ibuprofen (ADVIL,MOTRIN) 600 MG tablet Take 1 tablet (600 mg total) by mouth every 6 (six) hours as needed. 11/19/17  Yes Joy, Shawn C, PA-C  ondansetron (ZOFRAN ODT) 8 MG disintegrating tablet Take 1 tablet (8 mg total) by mouth every 8 (eight) hours as needed for nausea or vomiting. 11/19/17  Yes Joy, Shawn C, PA-C  traMADol (ULTRAM) 50 MG tablet Take  1 tablet (50 mg total) by mouth every 6 (six) hours as needed. 11/19/17  Yes Joy, Shawn C, PA-C  fluconazole (DIFLUCAN) 150 MG tablet Take 150 mg by mouth daily.    [provider]  metroNIDAZOLE (FLAGYL) 500 MG tablet Take 500 mg by mouth 2 (two) times daily.    [provider]    Family History No family history on file.  Social History Social History   Tobacco Use  . Smoking status: Never Smoker  . Smokeless tobacco: Never Used  Substance Use Topics  . Alcohol use: No    Frequency: Never  . Drug use: No     Allergies   Patient has no known allergies.   Review of Systems Review of Systems  Constitutional: Positive for fever. Negative for chills.  Respiratory: Negative for shortness of breath.   Cardiovascular: Negative for chest pain.  Gastrointestinal: Positive for abdominal pain, nausea and vomiting. Negative for constipation and diarrhea.  Genitourinary: Positive for flank pain, hematuria and vaginal bleeding. Negative for dysuria.  Neurological: Positive for headaches.  All other systems reviewed and are negative.    Physical Exam Updated Vital Signs BP 102/66 (BP Location: Right Arm)   Pulse 72   Temp 99 F (37.2 C)   Resp 18   Ht 5\' 2"  (1.575 m)   Wt 71.7 kg (158 lb)   LMP 11/15/2017   SpO2 100%  BMI 28.90 kg/m   Physical Exam  Constitutional: She is oriented to person, place, and time. She appears well-developed and well-nourished. No distress.  HENT:  Head: Normocephalic and atraumatic.  Eyes: Conjunctivae are normal. Pupils are equal, round, and reactive to light. Right eye exhibits no discharge. Left eye exhibits no discharge. No scleral icterus.  Neck: Normal range of motion.  Cardiovascular: Normal rate and regular rhythm. Exam reveals no gallop and no friction rub.  No murmur heard. Pulmonary/Chest: Effort normal and breath sounds normal. No stridor. No respiratory distress. She has no wheezes. She has no rales. She exhibits  no tenderness.  Abdominal: Soft. Bowel sounds are normal. She exhibits no distension and no mass. There is tenderness (Mild right CVA tenderness). There is no rebound and no guarding.  Prior C-section scar  Neurological: She is alert and oriented to person, place, and time.  Skin: Skin is warm and dry.  Psychiatric: She has a normal mood and affect. Her behavior is normal.  Nursing note and vitals reviewed.    ED Treatments / Results  Labs (all labs ordered are listed, but only abnormal results are displayed) Labs Reviewed  COMPREHENSIVE METABOLIC PANEL - Abnormal; Notable for the following components:      Result Value   CO2 21 (*)    Glucose, Bld 114 (*)    Calcium 8.7 (*)    Albumin 3.0 (*)    AST 14 (*)    ALT 12 (*)    All other components within normal limits  CBC - Abnormal; Notable for the following components:   WBC 20.1 (*)    Hemoglobin 11.2 (*)    HCT 33.7 (*)    All other components within normal limits  URINALYSIS, ROUTINE W REFLEX MICROSCOPIC - Abnormal; Notable for the following components:   APPearance CLOUDY (*)    Hgb urine dipstick LARGE (*)    Ketones, ur 15 (*)    Leukocytes, UA SMALL (*)    All other components within normal limits  URINALYSIS, MICROSCOPIC (REFLEX) - Abnormal; Notable for the following components:   Bacteria, UA MANY (*)    Squamous Epithelial / LPF 6-30 (*)    All other components within normal limits  I-STAT BETA HCG BLOOD, ED ([facility], [facility], [facility] ONLY) - Abnormal; Notable for the following components:   I-stat hCG, quantitative 11.9 (*)    All other components within normal limits  LIPASE, BLOOD  PREGNANCY, URINE    EKG  EKG Interpretation None       Radiology Dg Chest 2 View  Result Date: 11/19/2017 CLINICAL DATA:  Fever and body aches. EXAM: CHEST  2 VIEW COMPARISON:  None. FINDINGS: The cardiomediastinal contours are normal. Low lung volumes with bronchovascular crowding. Calcified granuloma in the left lower lung zone. No  consolidation, pleural effusion, or pneumothorax. No acute osseous abnormalities are seen. IMPRESSION: Low lung volumes with bronchovascular crowding. No evidence of pneumonia. Electronically Signed   By: Rubye OaksMelanie  Ehinger M.D.   On: 11/19/2017 06:59   Ct Abdomen Pelvis W Contrast  Result Date: 11/19/2017 CLINICAL DATA:  Right lower quadrant pain EXAM: CT ABDOMEN AND PELVIS WITH CONTRAST TECHNIQUE: Multidetector CT imaging of the abdomen and pelvis was performed using the standard protocol following bolus administration of intravenous contrast. CONTRAST:  100mL ISOVUE-300 IOPAMIDOL (ISOVUE-300) INJECTION 61% COMPARISON:  None FINDINGS: Lower chest: Dependent atelectasis in the lung bases. Calcified granuloma in the left base. Heart is normal size. No effusions. Hepatobiliary: No focal hepatic abnormality. Gallbladder  unremarkable. Pancreas: No focal abnormality or ductal dilatation. Spleen: No focal abnormality.  Normal size. Adrenals/Urinary Tract: Areas of decreased enhancement throughout the right kidney compatible with pyelonephritis, best seen in the mid and lower poles. No hydronephrosis. Left kidney, adrenal glands and urinary bladder unremarkable. Stomach/Bowel: Normal retrocecal appendix. Stomach, large and small bowel grossly unremarkable. Vascular/Lymphatic: No evidence of aneurysm or adenopathy. Reproductive: 6.5 cm cyst in the right adnexa. Uterus and left adnexa unremarkable. Other: No free fluid or free air. Musculoskeletal: No acute bony abnormality. IMPRESSION: Areas of decreased enhancement in the mid and lower poles of the right kidney compatible with pyelonephritis. Normal retrocecal appendix. 6.5 cm right ovarian cyst. Electronically Signed   By: Charlett Nose M.D.   On: 11/19/2017 09:30    Procedures Procedures (including critical care time)  Medications Ordered in ED Medications - No data to display   Initial Impression / Assessment and Plan / ED Course  I have reviewed the triage  vital signs and the nursing notes.  Pertinent labs & imaging results that were available during my care of the patient were reviewed by me and considered in my medical decision making (see chart for details).  21 year old female with pyelonephritis with nausea and vomiting.  Vital signs are normal.  She overall appears well.  She is calm and comfortable appearing.  She still has mild right CVA tenderness.  CBC is remarkable for mildly worsening leukocytosis.  CMP is overall unremarkable.  UA looks improved.  Pregnancy test is negative.  Discussed admission versus continuing outpatient treatment.  Patient has been able to tolerate her antibiotic this morning and fluids.  She feels comfortable going home as long as she does not have to take the tramadol.  I advise she could stop this medicine and follow up if symptoms are worsening.   Final Clinical Impressions(s) / ED Diagnoses   Final diagnoses:  Pyelonephritis  Nausea and vomiting, intractability of vomiting not specified, unspecified vomiting type    ED Discharge Orders    None       Bethel Born, PA-C 11/20/17 1108    Melene Plan, DO 11/20/17 1407

## 2017-11-21 LAB — URINE CULTURE: Culture: >=100000 — AB

## 2017-11-22 ENCOUNTER — Telehealth: Payer: Self-pay | Admitting: Emergency Medicine

## 2017-11-22 NOTE — Telephone Encounter (Signed)
Post ED Visit - Positive Culture Follow-up  Culture report reviewed by antimicrobial stewardship pharmacist:  []  Enzo BiNathan Batchelder, Pharm.D. []  Celedonio MiyamotoJeremy Frens, Pharm.D., BCPS AQ-ID []  Garvin FilaMike Maccia, Pharm.D., BCPS [x]  Georgina PillionElizabeth Martin, [address] BCPS []  MeridianMinh Pham, [address]Ph[address]D., BCPS, AAHIVP []  Estella HuskMichelle Turner, Pharm.D., BCPS, AAHIVP []  Lysle Pearlachel Rumbarger, PharmD, BCPS []  Casilda Carlsaylor Stone, PharmD, BCPS []  Pollyann SamplesAndy Johnston, PharmD, BCPS  Positive urine culture Treated with cephalexin, organism sensitive to the same and no further patient follow-up is required at this time.  Berle MullMiller, Tanija Germani 11/22/2017, 11:23 AM

## 2017-11-24 LAB — CULTURE, BLOOD (ROUTINE X 2): Culture: NO GROWTH

## 2017-11-25 LAB — CULTURE, BLOOD (ROUTINE X 2): Culture: NO GROWTH

## 2018-09-13 LAB — OB RESULTS CONSOLE GC/CHLAMYDIA
CHLAMYDIA, DNA PROBE: NEGATIVE
GC PROBE AMP, GENITAL: NEGATIVE

## 2018-09-13 LAB — OB RESULTS CONSOLE HEPATITIS B SURFACE ANTIGEN: Hepatitis B Surface Ag: NEGATIVE

## 2018-09-13 LAB — OB RESULTS CONSOLE RPR: RPR: NONREACTIVE

## 2018-09-13 LAB — OB RESULTS CONSOLE RUBELLA ANTIBODY, IGM: Rubella: IMMUNE

## 2018-09-13 LAB — OB RESULTS CONSOLE HIV ANTIBODY (ROUTINE TESTING): HIV: NONREACTIVE

## 2018-09-13 LAB — OB RESULTS CONSOLE ABO/RH: RH Type: POSITIVE

## 2018-09-13 LAB — OB RESULTS CONSOLE ANTIBODY SCREEN: ANTIBODY SCREEN: NEGATIVE

## 2018-09-14 ENCOUNTER — Encounter (HOSPITAL_COMMUNITY): Payer: Self-pay

## 2018-09-18 ENCOUNTER — Other Ambulatory Visit (HOSPITAL_COMMUNITY): Payer: Self-pay | Admitting: Family

## 2018-09-18 DIAGNOSIS — Z369 Encounter for antenatal screening, unspecified: Secondary | ICD-10-CM

## 2018-09-20 ENCOUNTER — Encounter (HOSPITAL_COMMUNITY): Payer: Self-pay | Admitting: *Deleted

## 2018-09-24 ENCOUNTER — Other Ambulatory Visit (HOSPITAL_COMMUNITY): Payer: Self-pay | Admitting: *Deleted

## 2018-09-24 ENCOUNTER — Ambulatory Visit (HOSPITAL_COMMUNITY)
Admission: RE | Admit: 2018-09-24 | Discharge: 2018-09-24 | Disposition: A | Discharge status: Home / Self Care | Payer: Medicaid Other | Source: Ambulatory Visit | Attending: Family | Admitting: Family

## 2018-09-24 ENCOUNTER — Ambulatory Visit (HOSPITAL_COMMUNITY): Admission: RE | Admit: 2018-09-24 | Payer: Medicaid Other | Source: Ambulatory Visit

## 2018-09-24 ENCOUNTER — Encounter (HOSPITAL_COMMUNITY): Payer: Self-pay

## 2018-09-24 ENCOUNTER — Other Ambulatory Visit (HOSPITAL_COMMUNITY): Payer: Self-pay | Admitting: Family

## 2018-09-24 DIAGNOSIS — Z369 Encounter for antenatal screening, unspecified: Secondary | ICD-10-CM

## 2018-09-24 DIAGNOSIS — Z3689 Encounter for other specified antenatal screening: Secondary | ICD-10-CM

## 2018-09-24 DIAGNOSIS — Z363 Encounter for antenatal screening for malformations: Secondary | ICD-10-CM

## 2018-09-24 DIAGNOSIS — O34219 Maternal care for unspecified type scar from previous cesarean delivery: Secondary | ICD-10-CM

## 2018-09-24 DIAGNOSIS — Z3682 Encounter for antenatal screening for nuchal translucency: Secondary | ICD-10-CM | POA: Insufficient documentation

## 2018-09-24 DIAGNOSIS — Z3A15 15 weeks gestation of pregnancy: Secondary | ICD-10-CM

## 2018-09-24 HISTORY — DX: Herpesviral infection, unspecified: B00.9

## 2018-09-24 HISTORY — DX: Tubulo-interstitial nephritis, not specified as acute or chronic: N12

## 2018-10-26 ENCOUNTER — Encounter (HOSPITAL_COMMUNITY): Payer: Self-pay

## 2018-10-26 ENCOUNTER — Ambulatory Visit (HOSPITAL_COMMUNITY): Payer: Self-pay

## 2019-02-15 ENCOUNTER — Encounter (HOSPITAL_COMMUNITY): Payer: Self-pay

## 2019-02-15 ENCOUNTER — Other Ambulatory Visit: Payer: Self-pay | Admitting: Obstetrics & Gynecology

## 2019-02-15 NOTE — Progress Notes (Signed)
Orders for surgery signed

## 2019-02-20 ENCOUNTER — Telehealth (HOSPITAL_COMMUNITY): Payer: Self-pay | Admitting: *Deleted

## 2019-02-20 NOTE — Pre-Procedure Instructions (Signed)
Interpreter number [phone]

## 2019-02-20 NOTE — Telephone Encounter (Signed)
Preadmission screen  

## 2019-02-21 ENCOUNTER — Other Ambulatory Visit: Payer: Self-pay | Admitting: Obstetrics & Gynecology

## 2019-02-21 ENCOUNTER — Other Ambulatory Visit: Payer: Self-pay | Admitting: Family Medicine

## 2019-02-21 NOTE — Progress Notes (Signed)
rLTCS preop orders placed.

## 2019-02-27 ENCOUNTER — Encounter (HOSPITAL_COMMUNITY): Payer: Self-pay

## 2019-02-27 NOTE — Pre-Procedure Instructions (Signed)
[id number] interpreter number

## 2019-03-03 ENCOUNTER — Inpatient Hospital Stay (HOSPITAL_COMMUNITY)
Admission: AD | Admit: 2019-03-03 | Discharge: 2019-03-03 | Disposition: A | Discharge status: Home / Self Care | Payer: Self-pay | Attending: Obstetrics & Gynecology | Admitting: Obstetrics & Gynecology

## 2019-03-03 DIAGNOSIS — Z3A38 38 weeks gestation of pregnancy: Secondary | ICD-10-CM | POA: Insufficient documentation

## 2019-03-03 DIAGNOSIS — O479 False labor, unspecified: Secondary | ICD-10-CM

## 2019-03-03 DIAGNOSIS — O471 False labor at or after 37 completed weeks of gestation: Secondary | ICD-10-CM | POA: Insufficient documentation

## 2019-03-03 NOTE — MAU Note (Signed)
Pt is a G3P2 at 38.3 weeks c/o ctx since Friday, today getting more frequent and painful.  No other Ob concerns.

## 2019-03-03 NOTE — MAU Provider Note (Signed)
RN Labor Kathleen Frey is a 22yo [id number] at 38w2 who presented to the MAU with contractions. Pregnancy uncomplicated, has planned repeat C/S scheduled for 03/07/19. Denies vaginal bleeding, leakage of fluids. Reports normal fetal movement. Observed in MAU for about an hour and cervix remained closed. Reassuring and reactive tracing - 120s/mod/+a/-d. Not in active labor and stable for discharge home. RN to review labor return precautions with patient.   Cristal Deer. Earlene Plater, DO OB/GYN Fellow

## 2019-03-03 NOTE — Discharge Instructions (Signed)
Contracciones de Braxton Hicks °Braxton Hicks Contractions °Las contracciones del útero pueden presentarse durante todo el embarazo, pero no siempre indican que la mujer está de parto. Es posible que usted haya tenido contracciones de práctica llamadas "contracciones de Braxton Hicks". A veces, se las confunde con el parto real. °¿Qué son las contracciones de Braxton Hicks? °Las contracciones de Braxton Hicks son espasmos que se producen en los músculos del útero antes del parto. A diferencia de las contracciones del parto verdadero, estas no producen el agrandamiento (la dilatación) ni el afinamiento del cuello uterino. Hacia el final del embarazo (entre las semanas 32 y 34), las contracciones de Braxton Hicks pueden presentarse más seguido y tornarse más intensas. A veces, resulta difícil distinguirlas del parto verdadero porque pueden ser muy molestas. No debe sentirse avergonzada si concurre al hospital con falso parto. °En ocasiones, la única forma de saber si el trabajo de parto es verdadero es que el médico determine si hay cambios en el cuello del útero. El médico le hará un examen físico y quizás le controle las contracciones. Si usted no está de parto verdadero, el examen debe indicar que el cuello uterino no está dilatado y que usted no ha roto bolsa. °Si no hay otros problemas de salud asociados con su embarazo, no habrá inconvenientes si la envían a su casa con un falso parto. Es posible que las contracciones de Braxton Hicks continúen hasta que se desencadene el parto verdadero. °Cómo diferenciar el trabajo de parto falso del verdadero °Trabajo de parto verdadero °· Las contracciones duran de 30 a 70 segundos. °· Las contracciones pueden tornarse muy regulares. °· La molestia generalmente se siente en la parte superior del útero y se extiende hacia la zona baja del abdomen y hacia la cintura. °· Las contracciones no desaparecen cuando usted camina. °· Las contracciones generalmente se hacen más  intensas y aumentan en frecuencia. °· El cuello uterino se dilata y se afina. °Parto falso °· En general, las contracciones son más cortas y no tan intensas como las del parto verdadero. °· En general, las contracciones son irregulares. °· A menudo, las contracciones se sienten en la parte delantera de la parte baja del abdomen y en la ingle. °· Las contracciones pueden desaparecer cuando usted camina o cambia de posición mientras está acostada. °· Las contracciones se vuelven más débiles y su duración es menor a medida que transcurre el tiempo. °· En general, el cuello uterino no se dilata ni se afina. °Siga estas indicaciones en su casa: ° °· Tome los medicamentos de venta libre y los recetados solamente como se lo haya indicado el médico. °· Continúe haciendo los ejercicios habituales y siga las demás indicaciones que el médico le dé. °· Coma y beba con moderación si cree que está de parto. °· Si las contracciones de Braxton Hicks le provocan incomodidad: °? Cambie de posición: si está acostada o descansando, camine; si está caminando, descanse. °? Siéntese y descanse en una bañera con agua tibia. °? Beba suficiente líquido como para mantener la orina de color amarillo pálido. La deshidratación puede provocar contracciones. °? Respire lenta y profundamente varias veces por hora. °· Vaya a todas las visitas de control prenatales y de control como se lo haya indicado el médico. Esto es importante. °Comuníquese con un médico si: °· Tiene fiebre. °· Siente dolor constante en el abdomen. °Solicite ayuda de inmediato si: °· Las contracciones se intensifican, se hacen más regulares y cercanas entre sí. °· Tiene una pérdida de líquido por la vagina. °· Elimina   una mucosidad sanguinolenta (pérdida del tapón mucoso). °· Tiene una hemorragia vaginal. °· Tiene un dolor en la zona lumbar que nunca tuvo antes. °· Siente que la cabeza del bebé empuja hacia abajo y ejerce presión en la zona pélvica. °· El bebé no se mueve tanto  como antes. °Resumen °· Las contracciones que se presentan antes del parto se conocen como contracciones de Braxton Hicks, falso parto o contracciones de práctica. °· En general, las contracciones de Braxton Hicks son más cortas, más débiles, con más tiempo entre una y otra, y menos regulares que las contracciones del parto verdadero. Las contracciones del parto verdadero se intensifican progresivamente y se tornan regulares y más frecuentes. °· Para controlar la molestia que producen las contracciones de Braxton Hicks, puede cambiar de posición, darse un baño templado y descansar, beber mucha agua o practicar la respiración profunda. °Esta información no tiene como fin reemplazar el consejo del médico. Asegúrese de hacerle al médico cualquier pregunta que tenga. °Document Released: 07/17/2017 Document Revised: 11/27/2017 Document Reviewed: 07/17/2017 °Elsevier Interactive Patient Education © 2019 [employer]vier Inc. ° °

## 2019-03-05 NOTE — Patient Instructions (Addendum)
      Instrucciones de Cirugia   Su cirugia esta programada para 03/07/2019  ( your procedure is scheduled on) Entre por la entrada C a la(s) [facility] a las 0830 de la [place] -(enter through  Entrance C at [facility] at 0830 AM    [address] [place], [place] [phone] e informenos de su llegada ( pick up phone, dial [phone] on arrival)     Por favor llame al [phone] si tiene algun problema la [place] de cirugia ( Please call this number if you have any problems the morning of surgery.).                  Recuerde: (Remember)  NO coma alimentos ( Do not eat food  (After Midnight) Desps de medianoche)   NO toma liquidos claros (Do not drink clear liquids (After Midnight) Desps de medianoche)   NO use joyas, maquillaje de ojos, lapiz labial, crema para el cuerpo o esmalte de unas oscuro - las unas de los pies pueden estar pintados. ( Do not wear jewelry, eye makeup, lipstick, body lotion, or dark fingernail polish). No puede usar desodorante ( you may wear deodorant)   NO afeitado 48 horaes de su [place]. (Do not shave 48 hours before your surgery)   NO traiga objetos de valor al hospital.  [facility] es responsible de cualquier petenencias u objetos de Licensed conveyancer traiga al [employer]. (Do not bring valuable to the hospital.  [facility] is not responsible for any belongings or valuables brought to the hospital)   [facility] medicinas la manana de la cirugia con un sorbito de agua nada (take these meds the morning of surgery with a SIP of water)     Contactos, dentaduras o Puentes no se pueden usar en [place]. (Contacts, dentures or bridgework cannot be worn in surgery).  Si va a ser ingresado despues de las [place], deje la [place] en el carro hasta que se le haya asignado una habitacion. ( If you are to be admitted after surgery, leave suitcase in car until your room has been assigned.)   Nombre y numerode telephono de  Programmer, multimedia  (Name and telephone number of your driver)     Instructiones especial Por favor leer las siguientes hojas informativas que le entregaron. (Please read over the following fact sheets that you were given)

## 2019-03-06 ENCOUNTER — Other Ambulatory Visit: Payer: Self-pay

## 2019-03-06 ENCOUNTER — Encounter (HOSPITAL_COMMUNITY)
Admission: RE | Admit: 2019-03-06 | Discharge: 2019-03-06 | Disposition: A | Discharge status: Home / Self Care | Payer: Medicaid Other | Source: Ambulatory Visit | Attending: Obstetrics & Gynecology | Admitting: Obstetrics & Gynecology

## 2019-03-06 HISTORY — DX: Other specified health status: Z78.9

## 2019-03-06 LAB — CBC
HCT: 35.8 % — ABNORMAL LOW (ref 36.0–46.0)
Hemoglobin: 11.5 g/dL — ABNORMAL LOW (ref 12.0–15.0)
MCH: 28.5 pg (ref 26.0–34.0)
MCHC: 32.1 g/dL (ref 30.0–36.0)
MCV: 88.6 fL (ref 80.0–100.0)
Platelets: 247 10*3/uL (ref 150–400)
RBC: 4.04 MIL/uL (ref 3.87–5.11)
RDW: 12.9 % (ref 11.5–15.5)
WBC: 11.7 10*3/uL — ABNORMAL HIGH (ref 4.0–10.5)
nRBC: 0 % (ref 0.0–0.2)

## 2019-03-06 LAB — TYPE AND SCREEN
ABO/RH(D): A POS
Antibody Screen: NEGATIVE

## 2019-03-06 LAB — RAPID HIV SCREEN (HIV 1/2 AB+AG)
HIV 1/2 Antibodies: NONREACTIVE
HIV-1 P24 Antigen - HIV24: NONREACTIVE

## 2019-03-07 ENCOUNTER — Encounter (HOSPITAL_COMMUNITY)
Admission: RE | Disposition: A | Discharge status: Home / Self Care | Payer: Self-pay | Source: Home / Self Care | Attending: Obstetrics & Gynecology

## 2019-03-07 ENCOUNTER — Inpatient Hospital Stay (HOSPITAL_COMMUNITY): Payer: Medicaid Other | Admitting: Anesthesiology

## 2019-03-07 ENCOUNTER — Inpatient Hospital Stay (HOSPITAL_COMMUNITY)
Admission: RE | Admit: 2019-03-07 | Discharge: 2019-03-09 | DRG: 788 | Disposition: A | Discharge status: Home / Self Care | Payer: Medicaid Other | Attending: Obstetrics & Gynecology | Admitting: Obstetrics & Gynecology

## 2019-03-07 ENCOUNTER — Encounter (HOSPITAL_COMMUNITY): Payer: Self-pay | Admitting: *Deleted

## 2019-03-07 DIAGNOSIS — O34211 Maternal care for low transverse scar from previous cesarean delivery: Secondary | ICD-10-CM | POA: Diagnosis present

## 2019-03-07 DIAGNOSIS — O9902 Anemia complicating childbirth: Secondary | ICD-10-CM | POA: Diagnosis present

## 2019-03-07 DIAGNOSIS — D649 Anemia, unspecified: Secondary | ICD-10-CM | POA: Diagnosis present

## 2019-03-07 DIAGNOSIS — Z98891 History of uterine scar from previous surgery: Secondary | ICD-10-CM

## 2019-03-07 DIAGNOSIS — Z3A39 39 weeks gestation of pregnancy: Secondary | ICD-10-CM

## 2019-03-07 LAB — ABO/RH: ABO/RH(D): A POS

## 2019-03-07 LAB — RPR: RPR Ser Ql: NONREACTIVE

## 2019-03-07 IMAGING — US US MFM OB COMP +14 WKS
1 series · 14 of 28 positions shown · non-contrast
Comparison: none

[Series 1: us mfm ob comp +14 wks · 54 acquisitions, 14 frames shown]
[im 2/54]
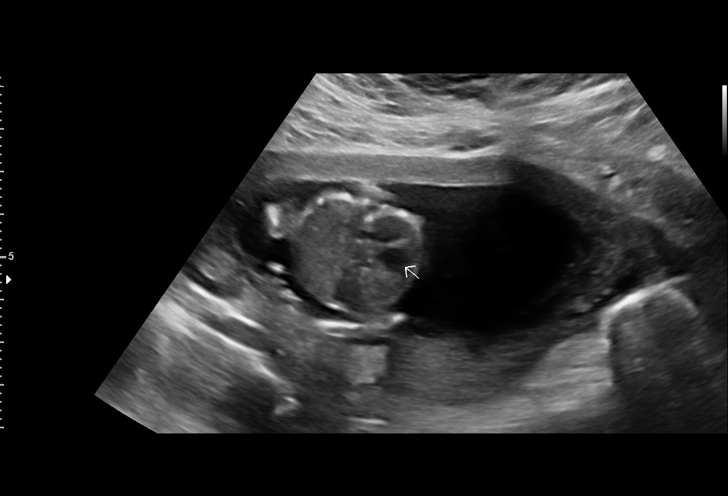
[im 6/54]
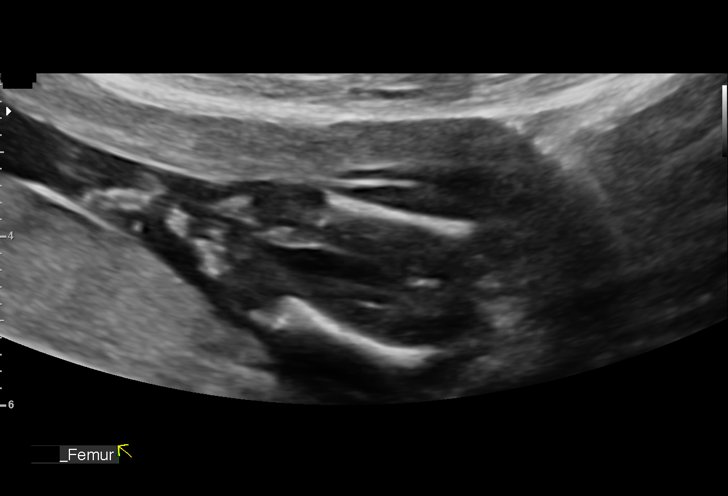
[im 10/54]
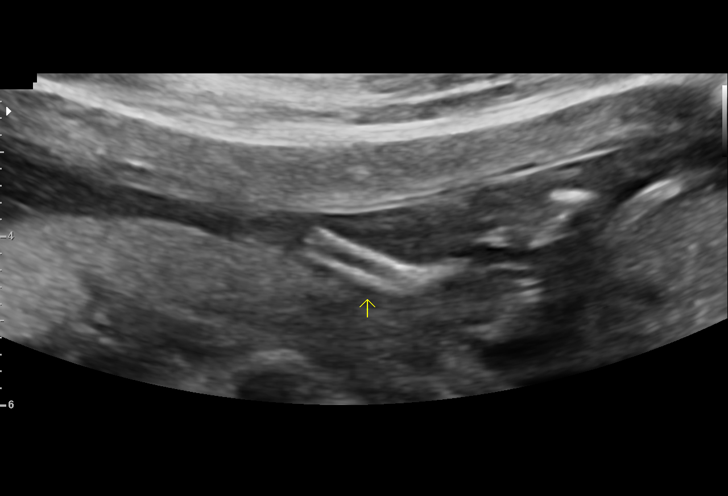
[im 14/54]
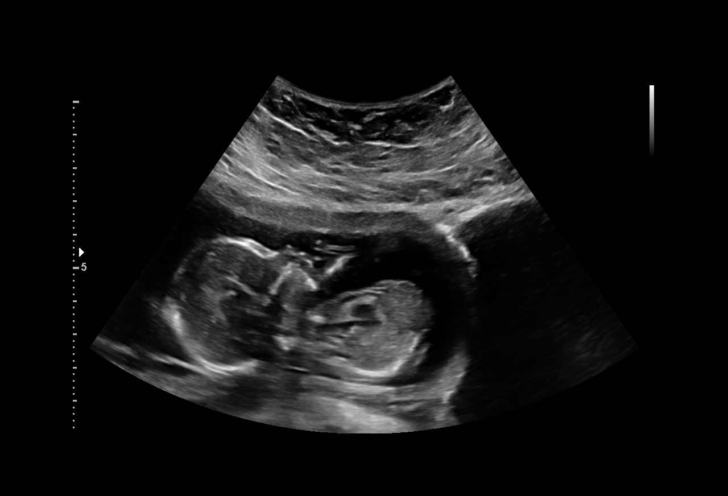
[im 18/54]
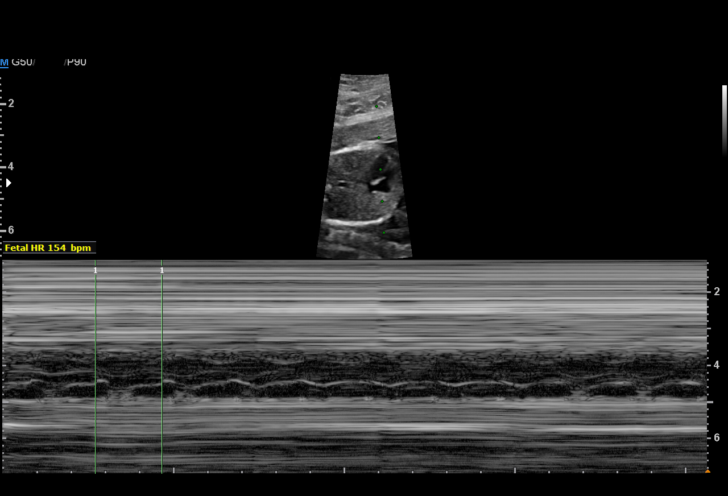
[im 22/54]
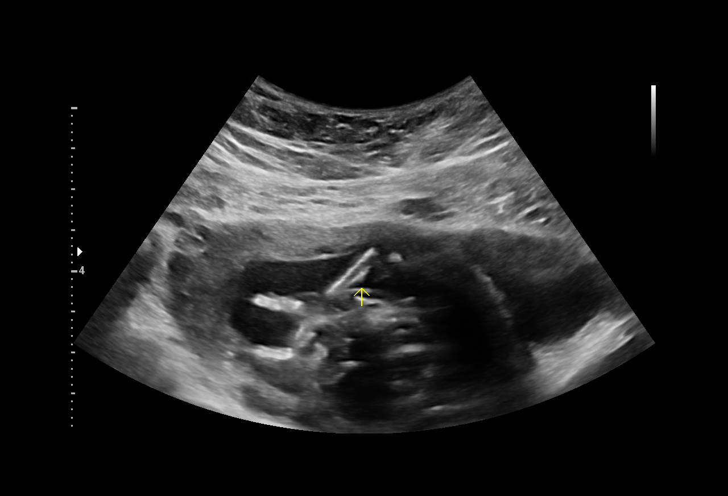
[im 26/54]
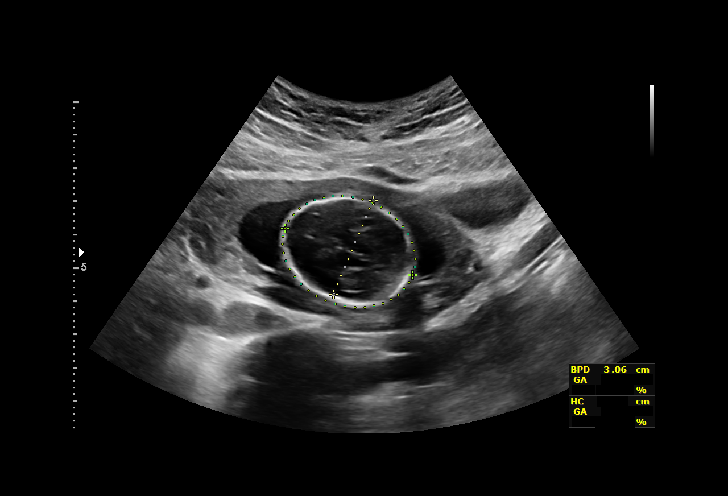
[im 30/54]
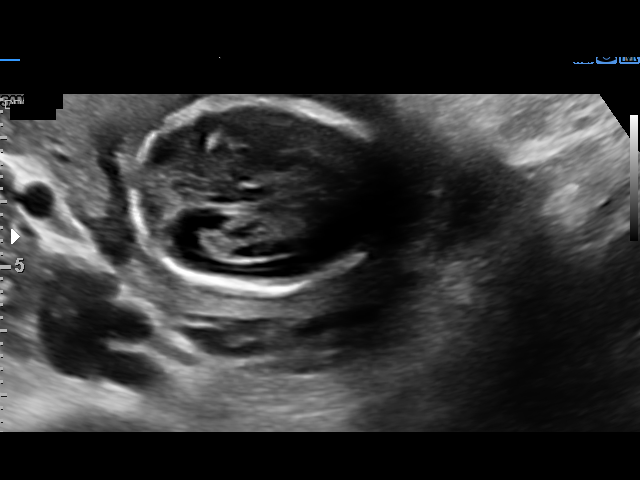
[im 34/54]
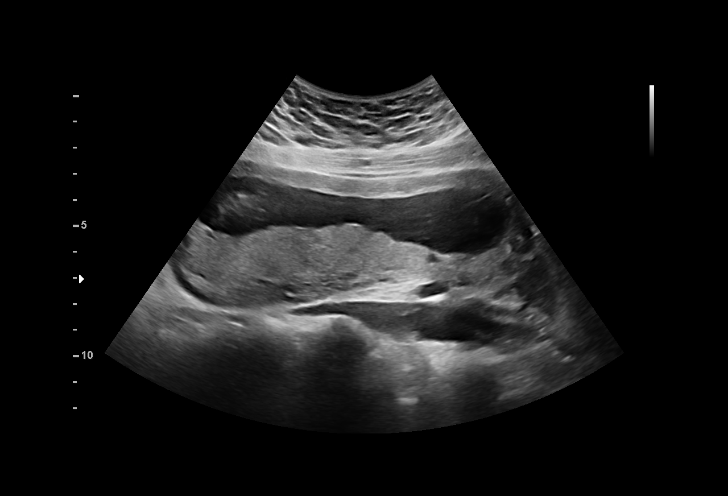
[im 38/54]
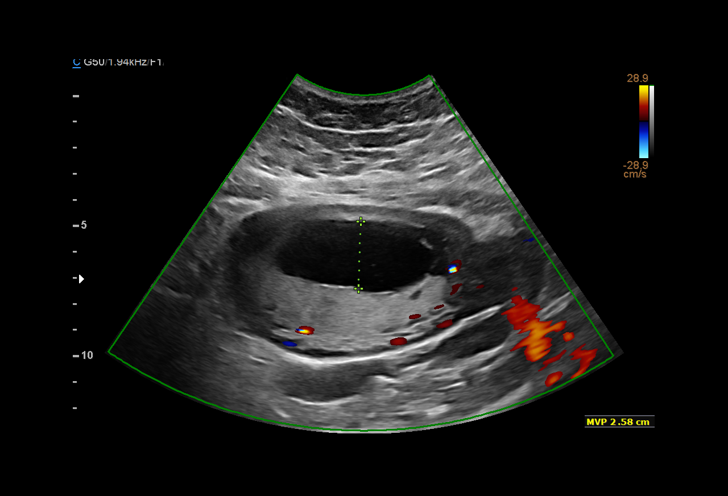
[im 42/54]
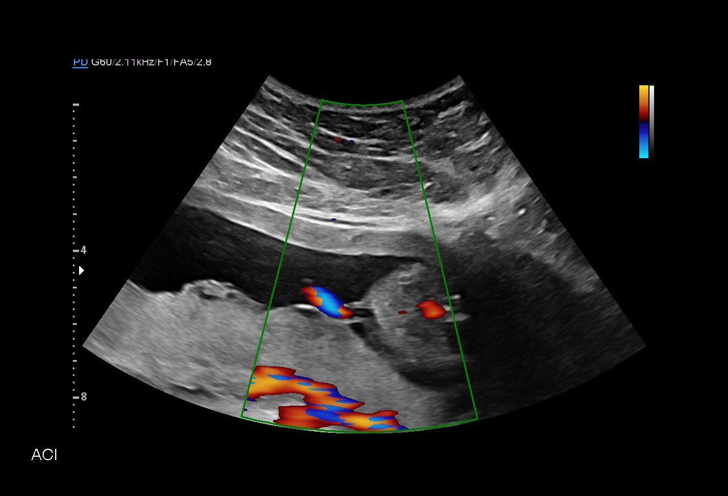
[im 46/54]
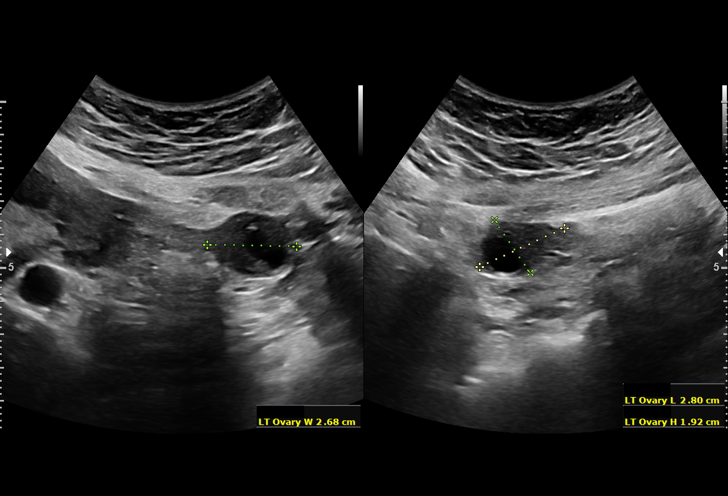
[im 50/54]
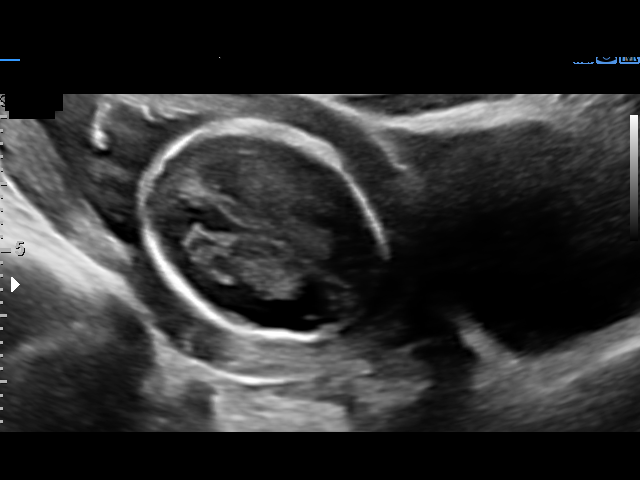
[im 54/54]
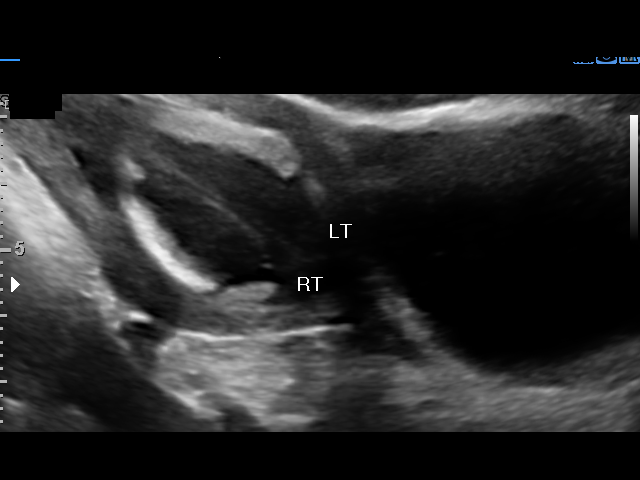

[14 of 28 positions shown; findings below may reference images not displayed]

[REDACTED] Department
[facility] NP

Joshy

Indications

Encounter for antenatal screening for
malformations
15 weeks gestation of pregnancy
History of cesarean delivery, currently
pregnant (x2)
Vital Signs

BMI:         28.16        Pulse:  71
BP:          117/64
Fetal Evaluation

Num Of Fetuses:         1
Fetal Heart Rate(bpm):  154
Cardiac Activity:       Observed
Presentation:           Transverse, head to maternal left
Placenta:               Posterior
P. Cord Insertion:      Visualized

Amniotic Fluid
AFI FV:      Within normal limits

Largest Pocket(cm)
2.58
Biometry
BPD:      30.1  mm     G. Age:  15w 4d         42  %    CI:        69.86   %    70 - 86
FL/HC:      15.3   %    13.3 -
HC:      114.9  mm     G. Age:  15w 4d         37  %    HC/AC:      1.20        1.05 -
AC:       95.8  mm     G. Age:  15w 5d         56  %    FL/BPD:     58.5   %
FL:       17.6  mm     G. Age:  15w 1d         31  %    FL/AC:      18.4   %    20 - 24
HUM:      18.1  mm     G. Age:  15w 1d         43  %

LV:        6.7  mm

Est. FW:     125  gm      0 lb 4 oz     69  %
OB History

Gravidity:    3         Term:   2
Living:       2
Gestational Age

LMP:           13w 3d        Date:  06/22/18                 EDD:   03/29/19
U/S Today:     15w 4d                                        EDD:   03/14/19
Best:          15w 4d     Det. By:  U/S (09/24/18)           EDD:   03/14/19
Anatomy

Cranium:               Appears normal         Aortic Arch:            Not well visualized
Cavum:                 Not well visualized    Ductal Arch:            Not well visualized
Ventricles:            Appears normal         Diaphragm:              Appears normal
Choroid Plexus:        RT Heterogeneous       Stomach:                Appears normal, left
sided
Cerebellum:            Not well visualized    Abdomen:                Appears normal
Posterior Fossa:       Not well visualized    Abdominal Wall:         Appears nml (cord
insert, abd wall)
Nuchal Fold:           Not well visualized    Cord Vessels:           Appears normal (3
vessel cord)
Face:                  Profile nl; orbits not Kidneys:                Appear normal
well visualized
Lips:                  Not well visualized    Bladder:                Appears normal
Thoracic:              Appears normal         Spine:                  Not well visualized
Heart:                 Not well visualized    Upper Extremities:      Visualized
RVOT:                  Not well visualized    Lower Extremities:      Visualized
LVOT:                  Not well visualized

Other:  Technically difficult due to early gestational age. Nasal bone
visualized.
Cervix Uterus Adnexa

Cervix
Normal appearance by transabdominal scan.

Uterus
No abnormality visualized.

Left Ovary
Size(cm)       2.8  x   1.9    x  2.7       Vol(ml):
Within normal limits.

Right Ovary
Not visualized.
Comments

U/S images reviewed. Findings reviewed with patient.   EDC
is determined by today's U/S.  Appropriate fetal growth is
noted.   No fetal abnormalities are seen.
Questions answered.
10 minutes spent face to face with patient.
Recommendations: 1) Completion of anatomy and growth  in
4 weeks
Recommendations

Completion of anatomy in 4 weeks

## 2019-03-07 SURGERY — Surgical Case
Anesthesia: Spinal

## 2019-03-07 MED ORDER — SODIUM CHLORIDE 0.9 % IV SOLN
INTRAVENOUS | Status: DC | PRN
  Administered 2019-03-07: 13:00:00 via INTRAVENOUS

## 2019-03-07 MED ORDER — CEFAZOLIN SODIUM-DEXTROSE 2-4 GM/100ML-% IV SOLN: 2.0000 g | INTRAVENOUS | Status: DC

## 2019-03-07 MED ORDER — IBUPROFEN 800 MG PO TABS
800.0000 mg | ORAL_TABLET | Freq: Four times a day (QID) | ORAL | Status: DC
  Administered 2019-03-08 – 2019-03-09 (×5): 800 mg via ORAL
  Filled 2019-03-07: qty 1
  Filled 2019-03-07: qty 4
  Filled 2019-03-07 (×2): qty 1

## 2019-03-07 MED ORDER — MORPHINE SULFATE (PF) 0.5 MG/ML IJ SOLN
INTRAMUSCULAR | Status: DC | PRN
  Administered 2019-03-07: 150 ug via INTRATHECAL

## 2019-03-07 MED ORDER — NALBUPHINE HCL 10 MG/ML IJ SOLN: 5.0000 mg | INTRAMUSCULAR | Status: DC | PRN

## 2019-03-07 MED ORDER — OXYTOCIN 40 UNITS IN NORMAL SALINE INFUSION - SIMPLE MED: 2.5000 [IU]/h | INTRAVENOUS | Status: AC

## 2019-03-07 MED ORDER — SOD CITRATE-CITRIC ACID 500-334 MG/5ML PO SOLN
ORAL | Status: AC
  Filled 2019-03-07: qty 15

## 2019-03-07 MED ORDER — ONDANSETRON HCL 4 MG/2ML IJ SOLN
INTRAMUSCULAR | Status: AC
  Filled 2019-03-07: qty 2

## 2019-03-07 MED ORDER — SENNOSIDES-DOCUSATE SODIUM 8.6-50 MG PO TABS
2.0000 | ORAL_TABLET | ORAL | Status: DC
  Administered 2019-03-07 – 2019-03-08 (×2): 2 via ORAL
  Filled 2019-03-07 (×2): qty 2

## 2019-03-07 MED ORDER — TETANUS-DIPHTH-ACELL PERTUSSIS 5-2.5-18.5 LF-MCG/0.5 IM SUSP: 0.5000 mL | Freq: Once | INTRAMUSCULAR | Status: DC

## 2019-03-07 MED ORDER — LACTATED RINGERS IV SOLN
INTRAVENOUS | Status: DC
  Administered 2019-03-07: 09:00:00 via INTRAVENOUS

## 2019-03-07 MED ORDER — SOD CITRATE-CITRIC ACID 500-334 MG/5ML PO SOLN
30.0000 mL | ORAL | Status: AC
  Administered 2019-03-07: 30 mL via ORAL

## 2019-03-07 MED ORDER — TRAMADOL HCL 50 MG PO TABS: 50.0000 mg | ORAL_TABLET | Freq: Four times a day (QID) | ORAL | Status: DC | PRN

## 2019-03-07 MED ORDER — FENTANYL CITRATE (PF) 100 MCG/2ML IJ SOLN
INTRAMUSCULAR | Status: DC | PRN
  Administered 2019-03-07: 15 ug via INTRATHECAL

## 2019-03-07 MED ORDER — PHENYLEPHRINE HCL-NACL 20-0.9 MG/250ML-% IV SOLN
INTRAVENOUS | Status: DC | PRN
  Administered 2019-03-07: 60 ug/min via INTRAVENOUS

## 2019-03-07 MED ORDER — ENOXAPARIN SODIUM 40 MG/0.4ML ~~LOC~~ SOLN
40.0000 mg | SUBCUTANEOUS | Status: DC
  Administered 2019-03-08: 40 mg via SUBCUTANEOUS
  Filled 2019-03-07: qty 0.4

## 2019-03-07 MED ORDER — SCOPOLAMINE 1 MG/3DAYS TD PT72
MEDICATED_PATCH | TRANSDERMAL | Status: DC | PRN
  Administered 2019-03-07: 1 via TRANSDERMAL

## 2019-03-07 MED ORDER — OXYCODONE-ACETAMINOPHEN 5-325 MG PO TABS: 2.0000 | ORAL_TABLET | ORAL | Status: DC | PRN

## 2019-03-07 MED ORDER — GABAPENTIN 100 MG PO CAPS
300.0000 mg | ORAL_CAPSULE | Freq: Two times a day (BID) | ORAL | Status: DC
  Administered 2019-03-08 – 2019-03-09 (×3): 300 mg via ORAL
  Filled 2019-03-07 (×3): qty 3

## 2019-03-07 MED ORDER — METOCLOPRAMIDE HCL 5 MG/ML IJ SOLN
INTRAMUSCULAR | Status: AC
  Filled 2019-03-07: qty 2

## 2019-03-07 MED ORDER — OXYCODONE HCL 5 MG PO TABS: 5.0000 mg | ORAL_TABLET | Freq: Once | ORAL | Status: AC | PRN

## 2019-03-07 MED ORDER — MEASLES, MUMPS & RUBELLA VAC IJ SOLR: 0.5000 mL | Freq: Once | INTRAMUSCULAR | Status: DC

## 2019-03-07 MED ORDER — NALOXONE HCL 4 MG/10ML IJ SOLN
1.0000 ug/kg/h | INTRAVENOUS | Status: DC | PRN
  Filled 2019-03-07: qty 5

## 2019-03-07 MED ORDER — LACTATED RINGERS IV SOLN
INTRAVENOUS | Status: DC
  Administered 2019-03-08: 02:00:00 via INTRAVENOUS

## 2019-03-07 MED ORDER — FENTANYL CITRATE (PF) 100 MCG/2ML IJ SOLN: 25.0000 ug | INTRAMUSCULAR | Status: DC | PRN

## 2019-03-07 MED ORDER — PHENYLEPHRINE HCL-NACL 20-0.9 MG/250ML-% IV SOLN
INTRAVENOUS | Status: AC
  Filled 2019-03-07: qty 250

## 2019-03-07 MED ORDER — COCONUT OIL OIL: 1.0000 "application " | TOPICAL_OIL | Status: DC | PRN

## 2019-03-07 MED ORDER — FERROUS SULFATE 325 (65 FE) MG PO TABS
325.0000 mg | ORAL_TABLET | Freq: Two times a day (BID) | ORAL | Status: DC
  Administered 2019-03-08 – 2019-03-09 (×3): 325 mg via ORAL
  Filled 2019-03-07 (×3): qty 1

## 2019-03-07 MED ORDER — MENTHOL 3 MG MT LOZG: 1.0000 | LOZENGE | OROMUCOSAL | Status: DC | PRN

## 2019-03-07 MED ORDER — CEFAZOLIN SODIUM-DEXTROSE 2-4 GM/100ML-% IV SOLN
INTRAVENOUS | Status: AC
  Filled 2019-03-07: qty 100

## 2019-03-07 MED ORDER — MEPERIDINE HCL 25 MG/ML IJ SOLN: 6.2500 mg | INTRAMUSCULAR | Status: DC | PRN

## 2019-03-07 MED ORDER — OXYCODONE-ACETAMINOPHEN 5-325 MG PO TABS: 1.0000 | ORAL_TABLET | ORAL | Status: DC | PRN

## 2019-03-07 MED ORDER — HYDROMORPHONE HCL 1 MG/ML IJ SOLN: 1.0000 mg | INTRAMUSCULAR | Status: DC | PRN

## 2019-03-07 MED ORDER — ONDANSETRON HCL 4 MG/2ML IJ SOLN: 4.0000 mg | Freq: Three times a day (TID) | INTRAMUSCULAR | Status: DC | PRN

## 2019-03-07 MED ORDER — ONDANSETRON HCL 4 MG/2ML IJ SOLN
INTRAMUSCULAR | Status: DC | PRN
  Administered 2019-03-07: 4 mg via INTRAVENOUS

## 2019-03-07 MED ORDER — SCOPOLAMINE 1 MG/3DAYS TD PT72: 1.0000 | MEDICATED_PATCH | Freq: Once | TRANSDERMAL | Status: DC

## 2019-03-07 MED ORDER — NALOXONE HCL 0.4 MG/ML IJ SOLN: 0.4000 mg | INTRAMUSCULAR | Status: DC | PRN

## 2019-03-07 MED ORDER — ZOLPIDEM TARTRATE 5 MG PO TABS: 5.0000 mg | ORAL_TABLET | Freq: Every evening | ORAL | Status: DC | PRN

## 2019-03-07 MED ORDER — BUPIVACAINE HCL (PF) 0.5 % IJ SOLN
INTRAMUSCULAR | Status: AC
  Filled 2019-03-07: qty 30

## 2019-03-07 MED ORDER — CEFAZOLIN SODIUM-DEXTROSE 2-4 GM/100ML-% IV SOLN
2.0000 g | INTRAVENOUS | Status: AC
  Administered 2019-03-07: 2 g via INTRAVENOUS

## 2019-03-07 MED ORDER — PHENYLEPHRINE 40 MCG/ML (10ML) SYRINGE FOR IV PUSH (FOR BLOOD PRESSURE SUPPORT)
PREFILLED_SYRINGE | INTRAVENOUS | Status: AC
  Filled 2019-03-07: qty 10

## 2019-03-07 MED ORDER — DIPHENHYDRAMINE HCL 25 MG PO CAPS: 25.0000 mg | ORAL_CAPSULE | Freq: Four times a day (QID) | ORAL | Status: DC | PRN

## 2019-03-07 MED ORDER — OXYCODONE HCL 5 MG/5ML PO SOLN
5.0000 mg | Freq: Once | ORAL | Status: AC | PRN
  Administered 2019-03-07: 5 mg via ORAL

## 2019-03-07 MED ORDER — OXYCODONE HCL 5 MG/5ML PO SOLN
ORAL | Status: AC
  Filled 2019-03-07: qty 5

## 2019-03-07 MED ORDER — ONDANSETRON HCL 4 MG/2ML IJ SOLN
4.0000 mg | Freq: Once | INTRAMUSCULAR | Status: AC | PRN
  Administered 2019-03-07: 4 mg via INTRAVENOUS

## 2019-03-07 MED ORDER — SIMETHICONE 80 MG PO CHEW
80.0000 mg | CHEWABLE_TABLET | ORAL | Status: DC | PRN
  Administered 2019-03-07: 80 mg via ORAL
  Filled 2019-03-07: qty 1

## 2019-03-07 MED ORDER — DIPHENHYDRAMINE HCL 50 MG/ML IJ SOLN: 12.5000 mg | INTRAMUSCULAR | Status: DC | PRN

## 2019-03-07 MED ORDER — OXYTOCIN 40 UNITS IN NORMAL SALINE INFUSION - SIMPLE MED
INTRAVENOUS | Status: AC
  Filled 2019-03-07: qty 1000

## 2019-03-07 MED ORDER — DEXAMETHASONE SODIUM PHOSPHATE 4 MG/ML IJ SOLN
INTRAMUSCULAR | Status: DC | PRN
  Administered 2019-03-07: 4 mg via INTRAVENOUS

## 2019-03-07 MED ORDER — WITCH HAZEL-GLYCERIN EX PADS: 1.0000 "application " | MEDICATED_PAD | CUTANEOUS | Status: DC | PRN

## 2019-03-07 MED ORDER — DEXAMETHASONE SODIUM PHOSPHATE 4 MG/ML IJ SOLN
INTRAMUSCULAR | Status: AC
  Filled 2019-03-07: qty 1

## 2019-03-07 MED ORDER — NALBUPHINE HCL 10 MG/ML IJ SOLN: 5.0000 mg | Freq: Once | INTRAMUSCULAR | Status: AC | PRN

## 2019-03-07 MED ORDER — NALBUPHINE HCL 10 MG/ML IJ SOLN
5.0000 mg | Freq: Once | INTRAMUSCULAR | Status: AC | PRN
  Administered 2019-03-07: 5 mg via INTRAVENOUS
  Filled 2019-03-07: qty 1

## 2019-03-07 MED ORDER — SODIUM CHLORIDE 0.9% FLUSH: 3.0000 mL | INTRAVENOUS | Status: DC | PRN

## 2019-03-07 MED ORDER — BUPIVACAINE IN DEXTROSE 0.75-8.25 % IT SOLN
INTRATHECAL | Status: DC | PRN
  Administered 2019-03-07: 1.8 mL via INTRATHECAL

## 2019-03-07 MED ORDER — METOCLOPRAMIDE HCL 5 MG/ML IJ SOLN
INTRAMUSCULAR | Status: DC | PRN
  Administered 2019-03-07: 10 mg via INTRAVENOUS

## 2019-03-07 MED ORDER — KETOROLAC TROMETHAMINE 30 MG/ML IJ SOLN
30.0000 mg | Freq: Four times a day (QID) | INTRAMUSCULAR | Status: AC
  Administered 2019-03-07 – 2019-03-08 (×3): 30 mg via INTRAVENOUS
  Filled 2019-03-07 (×3): qty 1

## 2019-03-07 MED ORDER — DIPHENHYDRAMINE HCL 25 MG PO CAPS: 25.0000 mg | ORAL_CAPSULE | ORAL | Status: DC | PRN

## 2019-03-07 MED ORDER — PRENATAL MULTIVITAMIN CH
1.0000 | ORAL_TABLET | Freq: Every day | ORAL | Status: DC
  Administered 2019-03-08 – 2019-03-09 (×2): 1 via ORAL
  Filled 2019-03-07 (×2): qty 1

## 2019-03-07 MED ORDER — DIBUCAINE 1 % RE OINT: 1.0000 "application " | TOPICAL_OINTMENT | RECTAL | Status: DC | PRN

## 2019-03-07 MED ORDER — MORPHINE SULFATE (PF) 0.5 MG/ML IJ SOLN
INTRAMUSCULAR | Status: AC
  Filled 2019-03-07: qty 10

## 2019-03-07 MED ORDER — FENTANYL CITRATE (PF) 100 MCG/2ML IJ SOLN
INTRAMUSCULAR | Status: AC
  Filled 2019-03-07: qty 2

## 2019-03-07 MED ORDER — SCOPOLAMINE 1 MG/3DAYS TD PT72
MEDICATED_PATCH | TRANSDERMAL | Status: AC
  Filled 2019-03-07: qty 1

## 2019-03-07 MED ORDER — MAGNESIUM HYDROXIDE 400 MG/5ML PO SUSP: 30.0000 mL | ORAL | Status: DC | PRN

## 2019-03-07 MED ORDER — OXYTOCIN 10 UNIT/ML IJ SOLN
INTRAVENOUS | Status: DC | PRN
  Administered 2019-03-07: 40 [IU] via INTRAVENOUS

## 2019-03-07 MED ORDER — SIMETHICONE 80 MG PO CHEW
80.0000 mg | CHEWABLE_TABLET | ORAL | Status: DC
  Administered 2019-03-08 (×2): 80 mg via ORAL
  Filled 2019-03-07 (×2): qty 1

## 2019-03-07 SURGICAL SUPPLY — 35 items
BARRIER ADHS 3X4 INTERCEED (GAUZE/BANDAGES/DRESSINGS) IMPLANT
CHLORAPREP W/TINT 26ML (MISCELLANEOUS) ×3 IMPLANT
CLAMP CORD UMBIL (MISCELLANEOUS) IMPLANT
CLOTH BEACON ORANGE TIMEOUT ST (SAFETY) ×3 IMPLANT
DRSG OPSITE POSTOP 4X10 (GAUZE/BANDAGES/DRESSINGS) ×3 IMPLANT
ELECT REM PT RETURN 9FT ADLT (ELECTROSURGICAL) ×3
ELECTRODE REM PT RTRN 9FT ADLT (ELECTROSURGICAL) ×1 IMPLANT
EXTRACTOR VACUUM KIWI (MISCELLANEOUS) IMPLANT
GAUZE SPONGE 4X4 12PLY STRL LF (GAUZE/BANDAGES/DRESSINGS) ×6 IMPLANT
GLOVE BIO SURGEON STRL SZ 6.5 (GLOVE) ×2 IMPLANT
GLOVE BIO SURGEONS STRL SZ 6.5 (GLOVE) ×1
GLOVE BIOGEL PI IND STRL 7.0 (GLOVE) ×2 IMPLANT
GLOVE BIOGEL PI INDICATOR 7.0 (GLOVE) ×4
GOWN STRL REUS W/TWL LRG LVL3 (GOWN DISPOSABLE) ×6 IMPLANT
KIT ABG SYR 3ML LUER SLIP (SYRINGE) IMPLANT
NEEDLE HYPO 22GX1.5 SAFETY (NEEDLE) IMPLANT
NEEDLE HYPO 25X5/8 SAFETYGLIDE (NEEDLE) IMPLANT
NS IRRIG 1000ML POUR BTL (IV SOLUTION) ×3 IMPLANT
PACK C SECTION WH (CUSTOM PROCEDURE TRAY) ×3 IMPLANT
PAD ABD 7.5X8 STRL (GAUZE/BANDAGES/DRESSINGS) ×3 IMPLANT
PAD OB MATERNITY 4.3X12.25 (PERSONAL CARE ITEMS) ×3 IMPLANT
PENCIL SMOKE EVAC W/HOLSTER (ELECTROSURGICAL) ×3 IMPLANT
RETRACTOR WND ALEXIS 25 LRG (MISCELLANEOUS) IMPLANT
RTRCTR WOUND ALEXIS 25CM LRG (MISCELLANEOUS)
SUT PLAIN 2 0 (SUTURE) ×2
SUT PLAIN ABS 2-0 CT1 27XMFL (SUTURE) ×1 IMPLANT
SUT VIC AB 0 CT1 36 (SUTURE) ×18 IMPLANT
SUT VIC AB 2-0 CT1 27 (SUTURE) ×2
SUT VIC AB 2-0 CT1 TAPERPNT 27 (SUTURE) ×1 IMPLANT
SUT VIC AB 4-0 PS2 27 (SUTURE) ×3 IMPLANT
SYR CONTROL 10ML LL (SYRINGE) IMPLANT
TAPE PAPER 2X10 WHT MICROPORE (GAUZE/BANDAGES/DRESSINGS) ×3 IMPLANT
TOWEL OR 17X24 6PK STRL BLUE (TOWEL DISPOSABLE) ×3 IMPLANT
TRAY FOLEY W/BAG SLVR 14FR LF (SET/KITS/TRAYS/PACK) IMPLANT
WATER STERILE IRR 1000ML POUR (IV SOLUTION) ×3 IMPLANT

## 2019-03-07 NOTE — Transfer of Care (Signed)
Immediate Anesthesia Transfer of Care Note  Patient: Kathleen Frey  Procedure(s) Performed: CESAREAN SECTION (N/A )  Patient Location: PACU  Anesthesia Type:Spinal  Level of Consciousness: awake, alert  and oriented  Airway & Oxygen Therapy: Patient Spontanous Breathing  Post-op Assessment: Report given to RN and Post -op Vital signs reviewed and stable  Post vital signs: Reviewed and stable  Last Vitals:  Vitals Value Taken Time  BP 103/59 03/07/2019  1:30 PM  Temp    Pulse 73 03/07/2019  1:32 PM  Resp 22 03/07/2019  1:32 PM  SpO2 99 % 03/07/2019  1:32 PM  Vitals shown include unvalidated device data.  Last Pain:  Vitals:   03/07/19 0826  TempSrc: Oral  PainSc: 4          Complications: No apparent anesthesia complications

## 2019-03-07 NOTE — Anesthesia Preprocedure Evaluation (Signed)
Anesthesia Evaluation  Patient identified by MRN, date of birth, ID band Patient awake    Reviewed: Allergy & Precautions, H&P , NPO status , Patient's Chart, lab work & pertinent test results  History of Anesthesia Complications Negative for: history of anesthetic complications  Airway Mallampati: III  TM Distance: >3 FB Neck ROM: full    Dental no notable dental hx.    Pulmonary neg pulmonary ROS,    Pulmonary exam normal        Cardiovascular negative cardio ROS Normal cardiovascular exam     Neuro/Psych negative neurological ROS  negative psych ROS   GI/Hepatic negative GI ROS, Neg liver ROS,   Endo/Other  negative endocrine ROS  Renal/GU negative Renal ROS  negative genitourinary   Musculoskeletal   Abdominal   Peds  Hematology negative hematology ROS (+)   Anesthesia Other Findings   Reproductive/Obstetrics (+) Pregnancy                             Anesthesia Physical Anesthesia Plan  ASA: II  Anesthesia Plan: Spinal   Post-op Pain Management:    Induction:   PONV Risk Score and Plan: Ondansetron and Treatment may vary due to age or medical condition  Airway Management Planned:   Additional Equipment:   Intra-op Plan:   Post-operative Plan:   Informed Consent: I have reviewed the patients History and Physical, chart, labs and discussed the procedure including the risks, benefits and alternatives for the proposed anesthesia with the patient or authorized representative who has indicated his/her understanding and acceptance.       Plan Discussed with:   Anesthesia Plan Comments:         Anesthesia Quick Evaluation

## 2019-03-07 NOTE — Op Note (Signed)
Kathleen Frey PROCEDURE DATE: 03/07/2019  PREOPERATIVE DIAGNOSES: Intrauterine pregnancy at [redacted]w[redacted]d weeks gestation; repeat cesarean section  POSTOPERATIVE DIAGNOSES: The same  PROCEDURE: Repeat Low Transverse Cesarean Section  SURGEON:  Dr. Scheryl Darter  ASSISTANT:  Dr. Jen Mow  ANESTHESIOLOGY TEAM: Anesthesiologist: Lucretia Kern, MD CRNA: Graciela Husbands, CRNA  INDICATIONS: Kathleen Frey is a 23 y.o. [id number] at [redacted]w[redacted]d here for cesarean section secondary to the indications listed under preoperative diagnoses; please see preoperative note for further details.  The risks of cesarean section were discussed with the patient including but were not limited to: bleeding which may require transfusion or reoperation; infection which may require antibiotics; injury to bowel, bladder, ureters or other surrounding organs; injury to the fetus; need for additional procedures including hysterectomy in the event of a life-threatening hemorrhage; placental abnormalities wth subsequent pregnancies, incisional problems, thromboembolic phenomenon and other postoperative/anesthesia complications.   The patient concurred with the proposed plan, giving informed written consent for the procedure.    FINDINGS:  Viable female infant in cephalic presentation.  Apgars 7 and 8.  Clear amniotic fluid.  Intact placenta, three vessel cord.  Normal uterus, fallopian tubes and ovaries bilaterally.  ANESTHESIA: Spinal  INTRAVENOUS FLUIDS: 1500 ml   ESTIMATED BLOOD LOSS: 324 ml URINE OUTPUT:  150 ml SPECIMENS: Placenta sent to L&D COMPLICATIONS: None immediate  PROCEDURE IN DETAIL:  The patient preoperatively received intravenous antibiotics and had sequential compression devices applied to her lower extremities.  She was then taken to the operating room where spinal anesthesia was administered and was found to be adequate. She was then placed in a dorsal supine position with a leftward tilt,  and prepped and draped in a sterile manner.  A foley catheter was placed into her bladder and attached to constant gravity.  After an adequate timeout was performed, a Pfannenstiel skin incision was made with scalpel (preexisting scar was vertical skin) and carried through to the underlying layer of fascia. The fascia was incised in the midline, and this incision was extended bilaterally using the Mayo scissors.  Kocher clamps were applied to the superior aspect of the fascial incision and the underlying rectus muscles were dissected off bluntly and sharply.  A similar process was carried out on the inferior aspect of the fascial incision. The rectus muscles were separated in the midline and the peritoneum was entered bluntly. A bladder flap was created. The Alexis self-retaining retractor was introduced into the abdominal cavity.  Attention was turned to the lower uterine segment where a low transverse hysterotomy was made with a scalpel and extended bilaterally bluntly.  The infant was successfully delivered, the cord was clamped and cut after one minute, and the infant was handed over to the awaiting neonatology team. Uterine massage was then administered, and the placenta delivered intact with a three-vessel cord. The uterus was then cleared of clots and debris.  The hysterotomy was closed with 0 Vicryl in a running locked fashion, and an imbricating layer was also placed with 0 Vicryl.   The pelvis was cleared of all clot and debris. Hemostasis was confirmed on all surfaces.  The retractor was removed.  The peritoneum was closed with a 2-0 Vicryl running stitch. The fascia was then closed using 0 Vicryl in a running fashion.  The subcutaneous layer was irrigated, reapproximated with 2-0 plain gut interrupted stitches, and the skin was closed with a 4-0 Vicryl subcuticular stitch. The patient tolerated the procedure well. Sponge, instrument and needle counts were correct x 3.  She was taken to the recovery room  in stable condition.    Jen Mow, DO OB Fellow, [employer] for [employer], [facility]

## 2019-03-07 NOTE — Anesthesia Procedure Notes (Signed)

## 2019-03-07 NOTE — H&P (Signed)
Kathleen Frey is a 23 y.o. female presenting for repeat cesarean section at 39 weeks. [id number] [redacted]w[redacted]d 2 previous ces[place] sections in [place] patient at [facility] OB History    Gravida  3   Para  2   Term  2   Preterm      AB      Living  2     SAB      TAB      Ectopic      Multiple      Live Births             Past Medical History:  Diagnosis Date  . HSV infection   . Medical history non-contributory   . Pyelonephritis    Past Surgical History:  Procedure Laterality Date  . CESAREAN SECTION     Family History: family history is not on file. Social History:  reports that she has never smoked. She has never used smokeless tobacco. She reports that she does not drink alcohol or use drugs.     Maternal Diabetes: No Genetic Screening: Normal Maternal Ultrasounds/Referrals: Normal Fetal Ultrasounds or other Referrals:  None Maternal Substance Abuse:  No Significant Maternal Medications:  None Significant Maternal Lab Results:  None Other Comments:  None  Review of Systems  All other systems reviewed and are negative.  Maternal Medical History:  Fetal activity: Perceived fetal activity is normal.    Prenatal Complications - Diabetes: none.      Blood pressure 100/77, temperature 98.2 F (36.8 C), temperature source Oral, resp. rate 16, height 5' (1.524 m), weight 76.7 kg, last menstrual period 06/22/2018, SpO2 100 %. Maternal Exam:  Abdomen: Patient reports no abdominal tenderness. Surgical scars: low vertical midline.   Fundal height is 36.   Estimated fetal weight is 6.5 lb.    Introitus: not evaluated.   Cervix: not evaluated.   Physical Exam  Vitals reviewed. Constitutional: She appears well-developed.  HENT:  Head: Normocephalic.  Neck: Normal range of motion. Neck supple.  Cardiovascular: Normal rate.  Respiratory: Effort normal. No respiratory distress.  GI: Soft.  Skin: Skin is warm and dry.  Psychiatric: She has a  normal mood and affect. Her behavior is normal.    Prenatal labs: ABO, Rh: --/--/A POS, A POS (03/18 0932) Antibody: NEG (03/18 0932) Rubella: Immune (09/26 0000) RPR: Non Reactive (03/18 0916)  HBsAg: Negative (09/26 0000)  HIV: NON REACTIVE (03/18 0916)  GBS:     Assessment/Plan: Previous cesarean section X 2, plans repeat. The risks of cesarean section discussed with the patient included but were not limited to: bleeding which may require transfusion or reoperation; infection which may require antibiotics; injury to bowel, bladder, ureters or other surrounding organs; injury to the fetus; need for additional procedures including hysterectomy in the event of a life-threatening hemorrhage; placental abnormalities wth subsequent pregnancies, incisional problems, thromboembolic phenomenon and other postoperative/anesthesia complications. The patient concurred with the proposed plan, giving informed written consent for the procedure.   Patient has been NPO since 0000 she will remain NPO for procedure. Anesthesia and OR aware. Preoperative prophylactic antibiotics and SCDs ordered on call to the OR.  To OR when ready.     Scheryl Darter 03/07/2019, 9:43 AM

## 2019-03-07 NOTE — Lactation Note (Signed)
This note was copied from a baby's chart. Lactation Consultation Note  Patient Name: Kathleen Frey IRWER'X Date: 03/07/2019 Reason for consult: Initial assessment;Term  7 hours old FT female who is being 100% formula fed by her mother, she's a P3. Mom voiced she BF her other kids but she's no planning on BF this one, she's just going to do formula. Mom's feeding choice on admission was to do both breast and formula. She told LC that she tried latching baby on but it didn't work. Reviewed normal newborn behavior and let mom know that if she changes her mind and decides to BF (or pump and bottle) to let her RN know. Left BF brochure (SP) and feeding diary (SP) in her room in case she changes her mind. LC services no needed at this point.  Maternal Data    Feeding Feeding Type: Bottle Fed - Formula    Interventions Interventions: Breast feeding basics reviewed  Lactation Tools Discussed/Used     Consult Status Consult Status: PRN Follow-up type: In-patient    Kathyrn Warmuth Venetia Constable 03/07/2019, 7:55 PM

## 2019-03-07 NOTE — Plan of Care (Signed)
  Problem: Education: Goal: Knowledge of General Education information will improve Description Including pain rating scale, medication(s)/side effects and non-pharmacologic comfort measures Note:  Admission education, safety and unit protocols reviewed with patient and significant other using agency interpreter, Maretta Los. Earl Gala, Linda Hedges [place]

## 2019-03-08 ENCOUNTER — Encounter (HOSPITAL_COMMUNITY): Payer: Self-pay | Admitting: Obstetrics & Gynecology

## 2019-03-08 LAB — CREATININE, SERUM
Creatinine, Ser: 0.6 mg/dL (ref 0.44–1.00)
GFR calc Af Amer: >60 mL/min (ref >60)
GFR calc non Af Amer: >60 mL/min (ref >60)

## 2019-03-08 LAB — CBC
HEMATOCRIT: 27.9 % — AB (ref 36.0–46.0)
HEMOGLOBIN: 9.6 g/dL — AB (ref 12.0–15.0)
MCH: 29.6 pg (ref 26.0–34.0)
MCHC: 34.4 g/dL (ref 30.0–36.0)
MCV: 86.1 fL (ref 80.0–100.0)
Platelets: 215 10*3/uL (ref 150–400)
RBC: 3.24 MIL/uL — ABNORMAL LOW (ref 3.87–5.11)
RDW: 12.7 % (ref 11.5–15.5)
WBC: 11.5 10*3/uL — ABNORMAL HIGH (ref 4.0–10.5)
nRBC: 0 % (ref 0.0–0.2)

## 2019-03-08 NOTE — Progress Notes (Signed)
Ordered pt meals, check on her needs, by Viria Alvarez Spanish Interpreter. 

## 2019-03-08 NOTE — Anesthesia Postprocedure Evaluation (Signed)
Anesthesia Post Note  Patient: Kathleen Frey  Procedure(s) Performed: CESAREAN SECTION (N/A )     Patient location during evaluation: Mother Baby Anesthesia Type: Spinal Level of consciousness: awake Pain management: pain level controlled Vital Signs Assessment: post-procedure vital signs reviewed and stable Respiratory status: spontaneous breathing Cardiovascular status: stable Postop Assessment: able to ambulate, patient able to bend at knees and spinal receding Anesthetic complications: no Comments: Spoke to RN caring for patient;states pt ambulating and is doing well without complaints    Last Vitals:  Vitals:   03/08/19 0530 03/08/19 0900  BP: (!) 91/52 104/62  Pulse: (!) 57 65  Resp:  16  Temp:  36.6 C  SpO2:  100%    Last Pain:  Vitals:   03/08/19 0900  TempSrc: Oral  PainSc: 1    Pain Goal: Patients Stated Pain Goal: 3 (03/07/19 1450)                 Edison Pace

## 2019-03-08 NOTE — Progress Notes (Addendum)
POSTPARTUM PROGRESS NOTE  POD #1  Subjective:  Kathleen Frey is a 23 y.o. [id number] s/p rLTCS at [redacted]w[redacted]d  She reports she doing well. No acute events overnight. She reports she is doing well. She denies any problems with ambulating, voiding or po intake. Denies nausea or vomiting. She has passed flatus. Pain is well controlled.  Lochia is mild.  Objective: Blood pressure (!) 101/58, pulse 70, temperature 97.7 F (36.5 C), temperature source Oral, resp. rate 16, height 5' (1.524 m), weight 76.7 kg, last menstrual period 06/22/2018, SpO2 100 %, unknown if currently breastfeeding.  Physical Exam:  General: alert, cooperative and no distress Chest: no respiratory distress Heart:regular rate, distal pulses intact Abdomen: soft, nontender,  Uterine Fundus: firm, appropriately tender DVT Evaluation: No calf swelling or tenderness Extremities: no LE edema Skin: warm, dry; thick dressing in place which is clean/dry/intact  Recent Labs    03/06/19 0916 03/08/19 0616  HGB 11.5* 9.6*  HCT 35.8* 27.9*    Assessment/Plan: Kathleen Zirbelis a 23y.o. G3P3003 s/p rLTCS at 382w0d POD#1 - Doing welll; pain well controlled. H/H appropriate  Routine postpartum care  OOB, ambulated  Lovenox for VTE prophylaxis Anemia: asymptomatic  Start po ferrous sulfate BID Contraception: IUD vs Depo Feeding: formula and breast milk  Dispo: Plan for discharge POD#2.   LOS: 1 day   KiMina MarbleD.O. CoCoto LaurelPGY1 03/08/2019, 5:22 PM   I personally saw and evaluated the patient, performing the key elements of the service. I developed and verified the management plan that is described in the resident's/student's note, and I agree with the content with my edits above. VSS, HRR&R, Resp unlabored, Legs neg.  FrNigel BertholdCNM 03/11/2019 10:18 AM

## 2019-03-09 MED ORDER — SENNOSIDES-DOCUSATE SODIUM 8.6-50 MG PO TABS: 2.0000 | ORAL_TABLET | ORAL | 0 refills | Status: DC

## 2019-03-09 MED ORDER — OXYCODONE-ACETAMINOPHEN 5-325 MG PO TABS: 1.0000 | ORAL_TABLET | ORAL | 0 refills | Status: DC | PRN

## 2019-03-09 MED ORDER — IBUPROFEN 800 MG PO TABS: 800.0000 mg | ORAL_TABLET | Freq: Four times a day (QID) | ORAL | 0 refills | Status: DC

## 2019-03-09 NOTE — Discharge Summary (Addendum)
Obstetrics Discharge Summary [facility]   Patient Name: Kathleen Frey DOB: [birth year] MRN: [id number]  Date of admission: 03/07/2019 Delivering MD: Adam Phenix   Date of discharge: 03/09/2019  Admitting diagnosis: RCS Intrauterine pregnancy: [redacted]w[redacted]d     Secondary diagnosis:   Active Problems:   H/O: cesarean section   Previous cesarean section .      Discharge diagnosis: Term Pregnancy Delivered                                            Postpartum procedures: None  Complications: None  Hospital course: Kathleen Frey is a 23 y.o. [redacted]w[redacted]d who was admitted for repeat CS. Her pregnancy was uncomplicated. Delivery was uncomplicated with EBL of . Please see delivery/op note for additional details. Her postpartum course was uncomplicated. She was breastfeeding without difficulty. By day of discharge, she was passing flatus, urinating, eating and drinking without difficulty. Her pain was well-controlled, and she was discharged home with percocet and ibuprofen. She will follow-up in clinic in 1 weeks.   Physical exam  Vitals:   03/08/19 0530 03/08/19 0900 03/08/19 1400 03/08/19 2330  BP: (!) 91/52 104/62 (!) 101/58 (!) 93/49  Pulse: (!) 57 65 70 (!) 58  Resp:  16 16 18   Temp:  97.9 F (36.6 C) 97.7 F (36.5 C) 98.2 F (36.8 C)  TempSrc:  Oral Oral Oral  SpO2:  100%    Weight:      Height:       General: well appearing, no distress Lochia: appropriate Uterine Fundus: firm Incision: No significant erythema, healing well with some dry blood on honeycomb dressing DVT Evaluation: No cords or calf tenderness. No significant calf/ankle edema.  Labs: Lab Results  Component Value Date   WBC 11.5 (H) 03/08/2019   HGB 9.6 (L) 03/08/2019   HCT 27.9 (L) 03/08/2019   MCV 86.1 03/08/2019   PLT 215 03/08/2019   CMP Latest Ref Rng & Units 03/08/2019  Glucose 65 - 99 mg/dL -  BUN 6 - 20 mg/dL -  Creatinine 5.85 - 9.29 mg/dL 2.44  Sodium 628 - 638  mmol/L -  Potassium 3.5 - 5.1 mmol/L -  Chloride 101 - 111 mmol/L -  CO2 22 - 32 mmol/L -  Calcium 8.9 - 10.3 mg/dL -  Total Protein 6.5 - 8.1 g/dL -  Total Bilirubin 0.3 - 1.2 mg/dL -  Alkaline Phos 38 - 177 U/L -  AST 15 - 41 U/L -  ALT 14 - 54 U/L -   Discharge instructions: Per After Visit Summary and "Baby and Me Booklet"  After visit meds:  Allergies as of 03/09/2019   No Known Allergies     Medication List    TAKE these medications   ibuprofen 800 MG tablet Commonly known as:  ADVIL,MOTRIN Take 1 tablet (800 mg total) by mouth every 6 (six) hours.   oxyCODONE-acetaminophen 5-325 MG tablet Commonly known as:  PERCOCET/ROXICET Take 1 tablet by mouth every 4 (four) hours as needed for moderate pain.   senna-docusate 8.6-50 MG tablet Commonly known as:  Senokot-S Take 2 tablets by mouth daily. Start taking on:  March 10, 2019       Postpartum contraception: still deciding between Depo and IUD Diet: Routine Diet Activity: Advance as tolerated. Pelvic rest for 6 weeks.   Outpatient follow up:1-2 weeks for incision check then 4  week for PP Follow-up Appt:No future appointments. Follow-up Visit:No follow-ups on file.  Newborn Data: Live born female  Birth Weight: 7 lb 3 oz (3260 g) APGAR: 7, 8  Newborn Delivery   Birth date/time:  03/07/2019 12:34:00 Delivery type:  C-Section, Low Transverse Trial of labor:  No C-section categorization:  Repeat     Baby Feeding: Bottle and Breast Disposition:home with mother  Burman Nieves, MD Family Medicine Resident  CNM attestation I have seen and examined this patient and agree with above documentation in the resident's note.   Kathleen Frey is a 23 y.o. [id number] s/p rLTCS.   Pain is well controlled.  Plan for birth control is Depo vs IUD.  Method of Feeding: both  PE:  BP (!) 93/49 (BP Location: Left Arm)   Pulse (!) 58   Temp 98.2 F (36.8 C) (Oral)   Resp 18   Ht 5' (1.524 m)   Wt 76.7 kg   LMP  06/22/2018 (Approximate)   SpO2 100%   Breastfeeding Unknown   BMI 33.01 kg/m  Fundus firm  No results for input(s): HGB, HCT in the last 72 hours.   Plan: discharge today - postpartum care discussed - f/u clinic in 1 week for incision check, then 4 weeks for postpartum visit   Kathleen Frey, CNM 1:42 PM

## 2019-03-09 NOTE — Lactation Note (Signed)
This note was copied from a baby's chart. Lactation Consultation Note  Patient Name: Kathleen Frey [id number] Date: 03/09/2019 Reason for consult: Follow-up assessment;Term;Infant weight loss  51 hours old FT female who is now being partially BF but mostly formula fed by her mother. Mom and baby are going home today, baby at 2% weight loss. Mom has been putting baby to the breast from time to time, but she's still using more bottles with Rush Barer formula. Reviewed engorgement prevention and treatment, treatment and prevention for sore nipples and red flags on when to call baby's pediatrician. Mom voiced she didn't have a pump at home, [facility] offered a hand pump from the hospital, instructions, cleaning and storage were reviewed, as well as milk storage guidelines. Parents reported all questions and concerns were answered, they're both aware of LC OP services and will contact if needed.  Maternal Data    Feeding    Interventions Interventions: Breast feeding basics reviewed;Hand pump  Lactation Tools Discussed/Used Tools: Pump Breast pump type: Manual Pump Review: Setup, frequency, and cleaning;Milk Storage Initiated by:: MPeck Date initiated:: 03/09/19   Consult Status Consult Status: Complete Date: 03/09/19 Follow-up type: Call as needed    Kathleen Frey 03/09/2019, 4:01 PM

## 2020-07-30 ENCOUNTER — Encounter: Payer: Self-pay | Admitting: Internal Medicine

## 2020-07-30 ENCOUNTER — Ambulatory Visit: Payer: Self-pay | Admitting: Internal Medicine

## 2020-07-30 ENCOUNTER — Other Ambulatory Visit: Payer: Self-pay

## 2020-07-30 VITALS — BP 110/80 | HR 64 | Resp 12 | Ht 61.5 in | Wt 146.0 lb

## 2020-07-30 DIAGNOSIS — K029 Dental caries, unspecified: Secondary | ICD-10-CM

## 2020-07-30 DIAGNOSIS — F439 Reaction to severe stress, unspecified: Secondary | ICD-10-CM

## 2020-07-30 DIAGNOSIS — R1013 Epigastric pain: Secondary | ICD-10-CM

## 2020-07-30 MED ORDER — PANTOPRAZOLE SODIUM 40 MG PO TBEC: 40.0000 mg | DELAYED_RELEASE_TABLET | Freq: Every day | ORAL | 3 refills | Status: DC

## 2020-07-30 NOTE — Patient Instructions (Signed)
Enfermedad de reflujo gastroesofgico en los adultos Gastroesophageal Reflux Disease, Adult El reflujo gastroesofgico (RGE) ocurre cuando el cido del estmago sube por el tubo que conecta la boca con el estmago (esfago). Normalmente, la comida baja por el esfago y se mantiene en el estmago, donde se la digiere. Sin embargo, cuando una persona tiene ERGE, los alimentos y el cido estomacal suelen volver al esfago. Si esto se vuelve un problema ms grave, a la persona se le puede diagnosticar una enfermedad llamada enfermedad de reflujo gastroesofgico (ERGE). La ERGE ocurre cuando el reflujo:  Sucede a menudo.  Causa sntomas frecuentes o graves.  Causa problemas tales como dao en el esfago. Cuando el cido del estmago entra en contacto con el esfago, el cido puede provocar dolor (inflamacin) en el esfago. Con el tiempo, pueden formarse pequeos agujeros (lceras) en el revestimiento del esfago. Cules son las causas? Esta afeccin se debe a un problema en el msculo que se encuentra entre el esfago y el estmago (esfnter esofgico inferior, o EEI). Normalmente, el EEI se cierra una vez que la comida pasa a travs del esfago hasta el estmago. Cuando el EEI se encuentra debilitado o tiene alguna anomala, no se cierra por completo, y eso permite que tanto la comida como el jugo gstrico, que es cido, vuelvan a subir por el esfago. El EEI puede debilitarse a causa de ciertas sustancias alimenticias, medicamentos y afecciones, que incluyen:  El consumo de tabaco.  Embarazo.  Tener una hernia de hiato.  Consumo de alcohol.  Ciertos alimentos y bebidas, como caf, chocolate, cebollas y menta. Qu incrementa el riesgo? Es ms probable que tenga esta afeccin si:  Tiene un aumento del peso corporal.  Tiene un trastorno del tejido conjuntivo.  Usa antiinflamatorios no esteroideos (AINE). Cules son los signos o los sntomas? Los sntomas de esta afeccin  incluyen:  Acidez estomacal.  Dificultad o dolor al tragar.  Sensacin de tener un bulto en la garganta.  Sabor amargo en la boca.  Mal aliento.  Gran cantidad de saliva.  Estmago inflamado o con malestar.  Eructos.  Dolor en el pecho. El dolor de pecho puede deberse a distintas afecciones. Es importante que consulte al mdico si tiene dolor de pecho.  Dificultad para respirar o sibilancias.  Tos constante (crnica) o tos nocturna.  Desgaste del esmalte dental.  Prdida de peso. Cmo se diagnostica? El mdico le har una historia clnica y un examen fsico. Para determinar si tiene ERGE leve o grave, el mdico tambin puede controlar cmo usted reacciona al tratamiento. Tambin pueden hacerle estudios, que incluyen los siguientes:  Un estudio para examinarle el estmago y el esfago con una cmara pequea (endoscopa).  Una prueba para medir el grado de acidez en el esfago.  Una prueba para medir cunta presin hay en el esfago.  Un estudio de deglucin con bario comn o modificado para ver la forma, el tamao y el funcionamiento del esfago. Cmo se trata? El objetivo del tratamiento es ayudar a aliviar los sntomas y evitar las complicaciones. El tratamiento de esta afeccin puede variar segn la gravedad de los sntomas. El mdico puede recomendarle lo siguiente:  Cambios en la dieta.  Medicamentos.  Una ciruga. Siga estas indicaciones en su casa: Comida y bebida   Siga la dieta recomendada por el mdico. Esto puede incluir evitar ciertos alimentos y bebidas, por ejemplo: ? Caf y t (con o sin cafena). ? Bebidas que contengan alcohol. ? Bebidas energticas y deportivas. ? Bebidas gaseosas o   refrescos. ? Chocolate y cacao. ? Menta y esencia de menta. ? Ajo y cebolla. ? Rbano picante. ? Alimentos condimentados, picantes y cidos, por ejemplo, todos los tipos de pimientas, chile en polvo, curry en polvo, vinagre, salsas picantes y salsa  barbacoa. ? Ctricos y sus jugos, por ejemplo, naranjas, limones y limas. ? Alimentos a base de tomate, como salsa de tomate, chile, salsa picante y pizza con salsa de tomate. ? Alimentos fritos y grasos, como donas, papas fritas y aderezos ricos en grasas. ? Carnes con alto contenido de grasa, como salchichas, y cortes de carnes rojas y blancas con mucha grasa, por ejemplo, chuletas o costillas, embutidos, jamn y tocino. ? Productos lcteos ricos en grasas, como leche entera, manteca y queso crema.  Haga comidas pequeas y frecuentes durante el da en lugar de comidas abundantes.  Evite beber grandes cantidades de lquidos con las comidas.  Evite comer 2 o 3horas antes de acostarse.  Evite recostarse inmediatamente despus de comer.  No haga ejercicios enseguida despus de comer. Estilo de vida   No consuma ningn producto que contenga nicotina o tabaco, como cigarrillos, cigarrillos electrnicos y tabaco de mascar. Si necesita ayuda para dejar de fumar, consulte al mdico.  Trate de reducir el estrs con mtodos como el yoga o la meditacin. Si necesita ayuda para reducir el nivel de estrs, consulte al mdico.  Si tiene sobrepeso, baje hasta llegar a un peso saludable para usted. Pdale consejos al mdico para bajar de peso de manera segura. Indicaciones generales  Est atento a cualquier cambio en los sntomas.  Tome los medicamentos de venta libre y los recetados solamente como se lo haya indicado el mdico. No tome aspirina, ibuprofeno ni otros AINE a menos que el mdico se lo indique.  Use ropa holgada. No use nada apretado alrededor de la cintura que haga presin sobre el abdomen.  Levante (eleve) la cabecera de la cama aproximadamente 6pulgadas (15cm).  Evite inclinarse si al hacerlo empeoran los sntomas.  Concurra a todas las visitas de seguimiento como se lo haya indicado el mdico. Esto es importante. Comunquese con un mdico si:  Tiene los siguientes  sntomas: ? Sntomas nuevos. ? Prdida de peso sin causa aparente. ? Dificultad o dolor al tragar. ? Sibilancias o una tos persistente. ? Voz ronca.  Los sntomas no mejoran con el tratamiento. Solicite ayuda inmediatamente si:  Siente dolor en los brazos, el cuello, la mandbula, los dientes o la espalda.  Se siente transpirado, mareado o tiene una sensacin de desvanecimiento.  Siente dolor intenso en el pecho o le falta el aire.  Vomita y el vmito tiene un aspecto similar a la sangre o a los posos de caf.  Se desmaya.  Tiene heces sanguinolentas o negras.  No puede tragar, beber o comer. Resumen  El reflujo gastroesofgico ocurre cuando el cido del estmago sube al esfago. La ERGE es una enfermedad en la que el reflujo ocurre con frecuencia, causa sntomas frecuentes o graves, o causa problemas tales como dao en el esfago.  El tratamiento de esta afeccin puede variar segn la gravedad de los sntomas. El mdico puede indicarle que siga una dieta y haga cambios en su estilo de vida, tome medicamentos o se someta a una ciruga.  Comunquese con un mdico si tiene sntomas nuevos o los sntomas empeoran.  Tome los medicamentos de venta libre y los recetados solamente como se lo haya indicado el mdico. No tome aspirina, ibuprofeno ni otros AINE a menos que el   mdico se lo indique.  Concurra a todas las visitas de seguimiento como se lo haya indicado el mdico. Esto es importante. Esta informacin no tiene como fin reemplazar el consejo del mdico. Asegrese de hacerle al mdico cualquier pregunta que tenga. Document Revised: 07/19/2018 Document Reviewed: 07/19/2018 Elsevier Patient Education  2020 Elsevier Inc.  

## 2020-07-30 NOTE — Progress Notes (Signed)
Subjective:    Patient ID: Kathleen Frey, female   DOB: 1996/04/09, 24 y.o.   MRN: [id number]   HPI   Here to establish  Has had problems with what she feels is GERD for past 9 years.   Has burning in midepigastrium and can radiate with a sharp pain to her back.   Burning can go up into throat.  Generally about an hour after eating spicy foods or salads Almost all food seems to make her symptoms worse.  She has daily symptoms, actually almost with every meal. No associated nausea. No melena or hematochezia. No definite weight loss associated. Does not lie down after eating. Denies eating a lot of tomatoes or onions. Drinks 1 cup of coffee daily and 3 times weekly, another cup in the afternoon. Drinks sodas maybe 3 times weekly.  Can be cola or sprite.  Does not take NSAIDS regularly.  No smoking or tobacco use. Occasional alcohol. Does not have head of bed elevated.   She does admit to increased stress currently.  She just started working and dealing with her children is hard.  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 Has lost 10 lbs since stress started.  Historically, her symptoms started after pregnant with first child in 2012.  Used OTC antacids.  Three years ago she was evaluated and treated.  She thinks she was treated with Omeprazole and possibly ?Simethicone.  Clinic was in [facility].   Took medication for 1 month, but did not feel the medication helped much.  She became pregnant and so stopped the medication as she did not have the symptoms when pregnant.  No outpatient medications have been marked as taking for the 07/30/20 encounter (Appointment) with Julieanne Manson, MD.   No Known Allergies   Past Medical History:  Diagnosis Date  . GERD (gastroesophageal reflux disease)   . HSV infection   . Pyelonephritis     Past Surgical History:  Procedure Laterality Date  . CESAREAN SECTION  2012, 2015  . CESAREAN SECTION N/A  03/07/2019   Procedure: CESAREAN SECTION;  Surgeon: Adam Phenix, MD;  Location: [facility] LD ORS;  Service: Obstetrics;  Laterality: N/A;    Family History  Problem Relation Age of Onset  . Hypertension Mother   . Hyperlipidemia Mother   . Alcohol abuse Father        complication of alcohol abuse--sounds like liver failure  . Cerebrovascular Accident Sister        Cerebral Embolism:  not clear etiology per patient  . Cancer Sister        Thyroid Cancer  . ADD / ADHD Son   . COPD Paternal Aunt     Social History   Socioeconomic History  . Marital status: Media planner    Spouse name: Jeanmarie Hubert  . Number of children: 3  . Years of education: Not on file  . Highest education level: 9th grade  Occupational History  . Occupation: [employer]  Tobacco Use  . Smoking status: Never Smoker  . Smokeless tobacco: Never Used  Vaping Use  . Vaping Use: Never used  Substance and Sexual Activity  . Alcohol use: Yes    Comment: occasional  . Drug use: No  . Sexual activity: Not on file  Other Topics Concern  . Not on file  Social History Narrative   Lives at home with long term boyfriend and two youngest children   Her oldest son lives in  [place] with her mother.   Boyfriend is father of her youngest Therapist, music.   Social Determinants of Health   Financial Resource Strain:   . Difficulty of Paying Living Expenses: Not on file  Food Insecurity:   . Worried About Programme researcher, broadcasting/film/video in the Last Year: Not on file  . Ran Out of Food in the Last Year: Not on file  Transportation Needs:   . Lack of Transportation (Medical): Not on file  . Lack of Transportation (Non-Medical): Not on file  Physical Activity:   . Days of Exercise per Week: Not on file  . Minutes of Exercise per Session: Not on file  Stress:   . Feeling of Stress : Not on file  Social Connections:   . Frequency of Communication with Friends and Family: Not on file  . Frequency of Social Gatherings  with Friends and Family: Not on file  . Attends Religious Services: Not on file  . Active Member of Clubs or Organizations: Not on file  . Attends Banker Meetings: Not on file  . Marital Status: Not on file  Intimate Partner Violence:   . Fear of Current or Ex-Partner: Not on file  . Emotionally Abused: Not on file  . Physically Abused: Not on file  . Sexually Abused: Not on file      Review of Systems    Objective:   Today's Vitals   07/30/20 1041  BP: 110/80  Pulse: 64  Resp: 12  Weight: 146 lb (66.2 kg)  Height: 5' 1.5" (1.562 m)   Body mass index is 27.14 kg/m.   Physical Exam  NAD HEENT:  PERRL, EOMI, TMs pearly gray, throat without injection Neck:  Supple, No adenopathy Chest:  CTA CV:  RRR with normal S1 and S2, No S3, S4, murmur or rub.  Radial and DP/PT pulses normal and equal. Abd:  S, Mild RUQ tenderness, no rebound or peritoneal signs.  No HSM or mass, + BS. Assessment & Plan  1.  Epigastric pain:  CBC, CMP, Lipase.  Abdominal U/S.  Pantoprazole 40 mg daily on empty stomach.  GERD precautions.  2.  Stress/History of trauma:  Referral to [employer], LCSW.  3.  At check out:  "need a dental referral"  Broke a tooth and needs cleaning:  Dental referral made

## 2020-07-31 LAB — CBC WITH DIFFERENTIAL/PLATELET
Basophils Absolute: 0.1 10*3/uL (ref 0.0–0.2)
Basos: 1 %
EOS (ABSOLUTE): 0 10*3/uL (ref 0.0–0.4)
Eos: 1 %
Hematocrit: 43.6 % (ref 34.0–46.6)
Hemoglobin: 13.9 g/dL (ref 11.1–15.9)
Immature Grans (Abs): 0 10*3/uL (ref 0.0–0.1)
Immature Granulocytes: 1 %
Lymphocytes Absolute: 2.2 10*3/uL (ref 0.7–3.1)
Lymphs: 28 %
MCH: 29.1 pg (ref 26.6–33.0)
MCHC: 31.9 g/dL (ref 31.5–35.7)
MCV: 91 fL (ref 79–97)
Monocytes Absolute: 0.5 10*3/uL (ref 0.1–0.9)
Monocytes: 7 %
Neutrophils Absolute: 4.9 10*3/uL (ref 1.4–7.0)
Neutrophils: 62 %
Platelets: 282 10*3/uL (ref 150–450)
RBC: 4.78 x10E6/uL (ref 3.77–5.28)
RDW: 11.3 % — ABNORMAL LOW (ref 11.7–15.4)
WBC: 7.7 10*3/uL (ref 3.4–10.8)

## 2020-07-31 LAB — COMPREHENSIVE METABOLIC PANEL
ALT: 11 IU/L (ref 0–32)
AST: 13 IU/L (ref 0–40)
Albumin/Globulin Ratio: 1.5 (ref 1.2–2.2)
Albumin: 4.8 g/dL (ref 3.9–5.0)
Alkaline Phosphatase: 75 IU/L (ref 48–121)
BUN/Creatinine Ratio: 19 (ref 9–23)
BUN: 11 mg/dL (ref 6–20)
Bilirubin Total: 0.6 mg/dL (ref 0.0–1.2)
CO2: 22 mmol/L (ref 20–29)
Calcium: 9.9 mg/dL (ref 8.7–10.2)
Chloride: 103 mmol/L (ref 96–106)
Creatinine, Ser: 0.57 mg/dL (ref 0.57–1.00)
GFR calc Af Amer: 149 mL/min/{1.73_m2} (ref >59)
GFR calc non Af Amer: 129 mL/min/{1.73_m2} (ref >59)
Globulin, Total: 3.3 g/dL (ref 1.5–4.5)
Glucose: 81 mg/dL (ref 65–99)
Potassium: 4.6 mmol/L (ref 3.5–5.2)
Sodium: 137 mmol/L (ref 134–144)
Total Protein: 8.1 g/dL (ref 6.0–8.5)

## 2020-07-31 LAB — LIPASE: Lipase: 28 U/L (ref 14–72)

## 2020-08-05 ENCOUNTER — Telehealth: Payer: Self-pay | Admitting: Clinical

## 2020-08-05 NOTE — Telephone Encounter (Signed)
LCSW contacted patient due to new patient to complete social determinants of health due to LCSW out, patient okay to call later in the afternoon to complete questionnaire and discuss referral from provider.

## 2020-10-05 DIAGNOSIS — R1013 Epigastric pain: Secondary | ICD-10-CM | POA: Insufficient documentation

## 2020-10-05 DIAGNOSIS — F439 Reaction to severe stress, unspecified: Secondary | ICD-10-CM | POA: Insufficient documentation

## 2020-10-05 DIAGNOSIS — K029 Dental caries, unspecified: Secondary | ICD-10-CM | POA: Insufficient documentation

## 2020-10-29 ENCOUNTER — Ambulatory Visit: Payer: Self-pay | Admitting: Internal Medicine

## 2022-06-02 ENCOUNTER — Ambulatory Visit: Payer: 59 | Admitting: Internal Medicine

## 2022-06-03 ENCOUNTER — Encounter: Payer: Self-pay | Admitting: Internal Medicine

## 2022-06-03 ENCOUNTER — Ambulatory Visit (INDEPENDENT_AMBULATORY_CARE_PROVIDER_SITE_OTHER): Payer: 59 | Admitting: Internal Medicine

## 2022-06-03 VITALS — BP 112/76 | HR 64 | Resp 12 | Ht 62.25 in | Wt 155.0 lb

## 2022-06-03 DIAGNOSIS — M25562 Pain in left knee: Secondary | ICD-10-CM

## 2022-06-03 DIAGNOSIS — M25561 Pain in right knee: Secondary | ICD-10-CM

## 2022-06-03 DIAGNOSIS — K219 Gastro-esophageal reflux disease without esophagitis: Secondary | ICD-10-CM

## 2022-06-03 MED ORDER — PANTOPRAZOLE SODIUM 40 MG PO TBEC: DELAYED_RELEASE_TABLET | ORAL | 3 refills | Status: DC

## 2022-06-03 NOTE — Progress Notes (Signed)
    Subjective:    Patient ID: Shiah Berhow, female   DOB: 25-Aug-1996, 26 y.o.   MRN: [id number]   HPI  Tildon Husky interprets  Here after 2 year hiatus.  GERD:  see visit from 2021 when established.  Has not been seen since then.  Was prescribed Pantoprazole.  States took for 3 months and then stopped taking it.  It helped with her symptoms, but since she stopped taking, her symptoms gradually returned and worsened--having burning into chest and throat.  Labs, including CBC, CMP, Lipase were all normal.  She never made appt with Danton Clap, LCSW to get counseling.  She continues to deal with high stress.  She does not feel she is having panic attacks     2.  Knee Pain:  Starting to go to gym, but when does squats, has significant pain in knees and knees pop with the squats.  Has been working out for 3 monhths  Current Meds  Medication Sig   levonorgestrel (MIRENA) 20 MCG/DAY IUD 1 each by Intrauterine route once.   Multiple Vitamin (MULTIVITAMIN WITH MINERALS) TABS tablet Take 1 tablet by mouth daily.   No Known Allergies  Past Medical History:  Diagnosis Date   GERD (gastroesophageal reflux disease)    HSV infection    Pyelonephritis    Past Surgical History:  Procedure Laterality Date   CESAREAN SECTION  2012, 2015   CESAREAN SECTION N/A 03/07/2019   Procedure: CESAREAN SECTION;  Surgeon: Adam Phenix, MD;  Location: [facility] LD ORS;  Service: Obstetrics;  Laterality: N/A;      Review of Systems    Objective:   BP 112/76 (BP Location: Left Arm, Patient Position: Sitting, Cuff Size: Normal)   Pulse 64   Resp 12   Ht 5' 2.25" (1.581 m)   Wt 155 lb (70.3 kg)   BMI 28.12 kg/m   Physical Exam NAD HEENT:  PERRL, EOMI, TMs pearly gray, throat without injection Neck:  Supple, No adenopathy, no thyromegaly Chest:  CTA CV:  RRR without murmur or rub.  Radial and DP pulses normal and equal. Abd:  S, NT, No HSM or mass, + BS LE:  No edema:  bilateral knees  without effusion and both with full ROM.  NT with posterior compression of patella against anterior joint.  No significant laxity of quads/patella tendon. Non tender joint lines bilaterally.  No cruciate or collateral ligament pain or laxity on stress maneuvers.     Assessment & Plan   GERD:  went over reflux precautions.  Pantoprazole 40 mg on empty stomach once daily prior to bedtime.  Elevate HOB.    2.  Knee popping and discomfort with squats:  Shallower squats and no weights with decreased reps for now and gradually increase as tolerated

## 2022-06-03 NOTE — Patient Instructions (Signed)
[facility]  Address: [address], [place], [place] [address]  Phone: [phone] Please walk into the clinic between 9 a.m. and 1 p.m. Mon-Fri to establish Virtual visits are available 

## 2022-09-02 ENCOUNTER — Encounter: Payer: Self-pay | Admitting: Internal Medicine

## 2022-09-02 ENCOUNTER — Ambulatory Visit (INDEPENDENT_AMBULATORY_CARE_PROVIDER_SITE_OTHER): Payer: 59 | Admitting: Internal Medicine

## 2022-09-02 VITALS — BP 110/66 | HR 68 | Resp 16 | Ht 62.75 in | Wt 162.0 lb

## 2022-09-02 DIAGNOSIS — K219 Gastro-esophageal reflux disease without esophagitis: Secondary | ICD-10-CM

## 2022-09-02 DIAGNOSIS — Z0000 Encounter for general adult medical examination without abnormal findings: Secondary | ICD-10-CM | POA: Diagnosis not present

## 2022-09-02 NOTE — Progress Notes (Signed)
Subjective:    Patient ID: Kathleen Frey, female   DOB: [birth year], 26 y.o.   MRN: [id number]   HPI  CPE without pap  1.  Pap: Had performed at [facility] in March and normal.  Had one abnormal about 5-6 years ago.  No treatment needed and resolved to normal exam 1 year later.    2.  Mammogram:  Never.   No family history of breast cancer.    3.  Osteoprevention:  Does not drink much milk as was not tolerating whole cow's milk.  Does occasionally use almond milk for milk shakes.  She is not very physically active.    4.  Guaiac Cards/FIT:  Never.    5.  Colonoscopy:  Never.  No family history of colon cancer.  6.  Immunizations:   Immunization History  Administered Date(s) Administered   Moderna Sars-Covid-2 Vaccination 04/25/2020, 05/23/2020   Tdap 01/07/2019     7.  Glucose/Cholesterol:  glucose fine in past.  Has never had cholesterol checked.     Current Meds  Medication Sig   levonorgestrel (MIRENA) 20 MCG/DAY IUD 1 each by Intrauterine route once.   pantoprazole (PROTONIX) 40 MG tablet 1 tab by mouth daily on empty stomach with water.   No Known Allergies  Past Medical History:  Diagnosis Date   GERD (gastroesophageal reflux disease)    HSV infection    Pyelonephritis    Past Surgical History:  Procedure Laterality Date   CESAREAN SECTION  2012, 2015   CESAREAN SECTION N/A 03/07/2019   Procedure: CESAREAN SECTION;  Surgeon: Adam Phenix, MD;  Location: [facility] LD ORS;  Service: Obstetrics;  Laterality: N/A;   Family History  Problem Relation Age of Onset   Hypertension Mother    Hyperlipidemia Mother    Alcohol abuse Father        complication of alcohol abuse--sounds like liver failure   Cerebrovascular Accident Sister        Cerebral Embolism:  not clear etiology per patient   Cancer Sister        Thyroid Cancer   ADD / ADHD Son    Social History   Socioeconomic History   Marital status: Media planner    Spouse name: Jeanmarie Hubert    Number of children: 3   Years of education: Not on file   Highest education level: 9th grade  Occupational History   Occupation: [employer]  Tobacco Use   Smoking status: Never    Passive exposure: Never   Smokeless tobacco: Never  Vaping Use   Vaping Use: Never used  Substance and Sexual Activity   Alcohol use: Yes    Comment: occasional   Drug use: No   Sexual activity: Yes    Birth control/protection: I.U.D.  Other Topics Concern   Not on file  Social History Narrative   Lives at home with long term boyfriend and two youngest children   Her oldest son lives in [place] with her mother.   Boyfriend is father of her youngest Therapist, music.   Social Determinants of Health   Financial Resource Strain: Low Risk  (09/02/2022)   Overall Financial Resource Strain (CARDIA)    Difficulty of Paying Living Expenses: Not hard at all  Food Insecurity: No Food Insecurity (09/02/2022)   Hunger Vital Sign    Worried About Running Out of Food in the Last Year: Never true    Ran Out of Food in the Last Year: Never true  Transportation Needs:  No Transportation Needs (09/02/2022)   PRAPARE - Administrator, Civil Service (Medical): No    Lack of Transportation (Non-Medical): No  Physical Activity: Not on file  Stress: Stress Concern Present (02/27/2019)   [employer] of Occupational Health - Occupational Stress Questionnaire    Feeling of Stress : To some extent  Social Connections: Not on file  Intimate Partner Violence: Not At Risk (09/02/2022)   Humiliation, Afraid, Rape, and Kick questionnaire    Fear of Current or Ex-Partner: No    Emotionally Abused: No    Physically Abused: No    Sexually Abused: No      Review of Systems  HENT:  Negative for dental problem (Has been to a dentist in [place].  Due for cleaning.).        No dental coverage, but goes for dental issues.  Does get cleanings.    Gastrointestinal:        Nausea resolved with use of Pantoprazole.   Not having any other symptoms.  Takes med daily, though occasionally forgets to take.    Psychiatric/Behavioral:         Stress with little children at home has improved.  She feels she is dealing better with the stress. Never contacted [employer] [place].        Objective:   BP 110/66 (BP Location: Right Arm, Patient Position: Sitting, Cuff Size: Normal)   Pulse 68   Resp 16   Ht 5' 2.75" (1.594 m)   Wt 162 lb (73.5 kg)   BMI 28.93 kg/m   Physical Exam HENT:     Head: Normocephalic and atraumatic.     Right Ear: Tympanic membrane, ear canal and external ear normal.     Left Ear: Tympanic membrane, ear canal and external ear normal.     Nose: Nose normal.     Mouth/Throat:     Mouth: Mucous membranes are moist.     Pharynx: Oropharynx is clear.  Eyes:     Extraocular Movements: Extraocular movements intact.     Conjunctiva/sclera: Conjunctivae normal.     Pupils: Pupils are equal, round, and reactive to light.     Comments: Discs sharp  Neck:     Thyroid: No thyroid mass or thyromegaly.  Cardiovascular:     Rate and Rhythm: Normal rate and regular rhythm.     Heart sounds: S1 normal and S2 normal. No murmur heard.    No friction rub. No S3 or S4 sounds.     Comments: Carotid, radial, femoral, DP and PT pulses normal and equal. Pulmonary:     Effort: Pulmonary effort is normal.     Breath sounds: Normal breath sounds.  Chest:     Comments: Deferred to [facility] clinic Abdominal:     General: Bowel sounds are normal.     Palpations: Abdomen is soft. There is no hepatomegaly, splenomegaly or mass.     Tenderness: There is no abdominal tenderness.     Hernia: No hernia is present.     Comments: Midline infraumbilical surgical scar  Genitourinary:    Comments: Deferred to [facility] clinic. Musculoskeletal:        General: Normal range of motion.     Cervical back: Normal range of motion and neck supple.     Right lower leg: No edema.     Left lower leg: No edema.   Lymphadenopathy:     Head:     Right side of head: No submental or  submandibular adenopathy.     Left side of head: No submental or submandibular adenopathy.     Cervical: No cervical adenopathy.     Upper Body:     Right upper body: No supraclavicular or axillary adenopathy.     Left upper body: No supraclavicular or axillary adenopathy.     Lower Body: No right inguinal adenopathy. No left inguinal adenopathy.  Skin:    General: Skin is warm.     Capillary Refill: Capillary refill takes less than 2 seconds.     Findings: No rash.  Neurological:     General: No focal deficit present.     Mental Status: She is alert and oriented to person, place, and time.     Cranial Nerves: Cranial nerves 2-12 are intact.     Sensory: Sensation is intact.     Motor: Motor function is intact.     Coordination: Coordination is intact.     Gait: Gait is intact.     Deep Tendon Reflexes: Reflexes are normal and symmetric.  Psychiatric:        Speech: Speech normal.        Behavior: Behavior normal. Behavior is cooperative.     Possible very mild extro of left eye.  No breast or GU exam.  Rest normal.  Vertical infrumbilical mildline surgical scar.  Assessment & Plan   CPE without pap Fasting labs in 2 weeks:  FLP, CBC, CMP  2.  GERD:  controlled  3.  Stress:  Would like to think about referral to [facility] for work on stress relief with caring for young children.

## 2022-09-16 ENCOUNTER — Other Ambulatory Visit (INDEPENDENT_AMBULATORY_CARE_PROVIDER_SITE_OTHER): Payer: 59

## 2022-09-16 DIAGNOSIS — Z0000 Encounter for general adult medical examination without abnormal findings: Secondary | ICD-10-CM | POA: Diagnosis not present

## 2022-09-17 LAB — CBC WITH DIFFERENTIAL/PLATELET
Basophils Absolute: 0.1 10*3/uL (ref 0.0–0.2)
Basos: 1 %
EOS (ABSOLUTE): 0.1 10*3/uL (ref 0.0–0.4)
Eos: 1 %
Hematocrit: 39.9 % (ref 34.0–46.6)
Hemoglobin: 13.2 g/dL (ref 11.1–15.9)
Immature Grans (Abs): 0 10*3/uL (ref 0.0–0.1)
Immature Granulocytes: 0 %
Lymphocytes Absolute: 2.2 10*3/uL (ref 0.7–3.1)
Lymphs: 30 %
MCH: 29.5 pg (ref 26.6–33.0)
MCHC: 33.1 g/dL (ref 31.5–35.7)
MCV: 89 fL (ref 79–97)
Monocytes Absolute: 0.4 10*3/uL (ref 0.1–0.9)
Monocytes: 5 %
Neutrophils Absolute: 4.7 10*3/uL (ref 1.4–7.0)
Neutrophils: 63 %
Platelets: 285 10*3/uL (ref 150–450)
RBC: 4.47 x10E6/uL (ref 3.77–5.28)
RDW: 10.9 % — ABNORMAL LOW (ref 11.7–15.4)
WBC: 7.4 10*3/uL (ref 3.4–10.8)

## 2022-09-17 LAB — COMPREHENSIVE METABOLIC PANEL
ALT: 8 IU/L (ref 0–32)
AST: 12 IU/L (ref 0–40)
Albumin/Globulin Ratio: 1.6 (ref 1.2–2.2)
Albumin: 4.5 g/dL (ref 4.0–5.0)
Alkaline Phosphatase: 70 IU/L (ref 44–121)
BUN/Creatinine Ratio: 21 (ref 9–23)
BUN: 12 mg/dL (ref 6–20)
Bilirubin Total: 0.5 mg/dL (ref 0.0–1.2)
CO2: 21 mmol/L (ref 20–29)
Calcium: 9.5 mg/dL (ref 8.7–10.2)
Chloride: 102 mmol/L (ref 96–106)
Creatinine, Ser: 0.57 mg/dL (ref 0.57–1.00)
Globulin, Total: 2.8 g/dL (ref 1.5–4.5)
Glucose: 86 mg/dL (ref 70–99)
Potassium: 4.1 mmol/L (ref 3.5–5.2)
Sodium: 136 mmol/L (ref 134–144)
Total Protein: 7.3 g/dL (ref 6.0–8.5)
eGFR: 127 mL/min/{1.73_m2} (ref >59)

## 2022-09-17 LAB — LIPID PANEL W/O CHOL/HDL RATIO
Cholesterol, Total: 166 mg/dL (ref 100–199)
HDL: 51 mg/dL (ref >39)
LDL Chol Calc (NIH): 101 mg/dL — ABNORMAL HIGH (ref 0–99)
Triglycerides: 71 mg/dL (ref 0–149)
VLDL Cholesterol Cal: 14 mg/dL (ref 5–40)

## 2022-10-06 DIAGNOSIS — K219 Gastro-esophageal reflux disease without esophagitis: Secondary | ICD-10-CM | POA: Insufficient documentation

## 2022-12-06 ENCOUNTER — Other Ambulatory Visit: Payer: Self-pay

## 2022-12-06 MED ORDER — PANTOPRAZOLE SODIUM 40 MG PO TBEC: DELAYED_RELEASE_TABLET | ORAL | 3 refills | Status: DC

## 2022-12-26 ENCOUNTER — Ambulatory Visit (INDEPENDENT_AMBULATORY_CARE_PROVIDER_SITE_OTHER): Payer: Self-pay | Admitting: Internal Medicine

## 2022-12-26 ENCOUNTER — Encounter: Payer: Self-pay | Admitting: Internal Medicine

## 2022-12-26 VITALS — BP 114/66 | HR 72 | Resp 16 | Ht 62.75 in | Wt 158.0 lb

## 2022-12-26 DIAGNOSIS — J019 Acute sinusitis, unspecified: Secondary | ICD-10-CM

## 2022-12-26 DIAGNOSIS — M25561 Pain in right knee: Secondary | ICD-10-CM | POA: Insufficient documentation

## 2022-12-26 DIAGNOSIS — J029 Acute pharyngitis, unspecified: Secondary | ICD-10-CM

## 2022-12-26 LAB — POC COVID19 BINAXNOW: SARS Coronavirus 2 Ag: NEGATIVE

## 2022-12-26 LAB — POCT INFLUENZA A/B
Influenza A, POC: NEGATIVE
Influenza B, POC: NEGATIVE

## 2022-12-26 LAB — POCT RAPID STREP A (OFFICE): Rapid Strep A Screen: NEGATIVE

## 2022-12-26 MED ORDER — AMOXICILLIN 500 MG PO CAPS: ORAL_CAPSULE | ORAL | 0 refills | Status: DC

## 2022-12-26 NOTE — Progress Notes (Signed)
    Subjective:    Patient ID: Kathleen Frey, female   DOB: Jun 15, 1996, 27 y.o.   MRN: [id number]   HPI  Has had illness for about 3 weeks.  Started with congestion and body aches.  1 week later, started with cough, felt bad tasting mucous in her throat and would cough up yellow to green mucous.  Still coughing up the discolored mucous.   She has more symptoms at night.   No sinus pressure or pain. No ear pain.   Has tried Theraflu, Mucinex, Nothing has really helped.      Current Meds  Medication Sig   levonorgestrel (MIRENA) 20 MCG/DAY IUD 1 each by Intrauterine route once.   pantoprazole (PROTONIX) 40 MG tablet 1 tab by mouth daily on empty stomach with water.   No Known Allergies   Review of Systems    Objective:   BP 114/66 (BP Location: Left Arm, Patient Position: Sitting, Cuff Size: Normal)   Pulse 72   Resp 16   Ht 5' 2.75" (1.594 m)   Wt 158 lb (71.7 kg)   BMI 28.21 kg/m   Physical Exam NAD HEENT:  PERRL, EOMI, NT over frontal and Maxillary sinus areas.  TMs pearly gray.  Nasal mucosa erythematous with crusting yellow mucous.  Posterior pharynx with lymphoid hypertrophy and yellow mucous drainage.  Tonsils without exudate or erythema.   Neck:  Supple, No adenopathy, no thyromegaly Chest:  CTA CV:  RRR without murmur or rub. Abd:  S, NT, No HSM or mass.   LE:  No edema  Rapid influenza and COVID negative.  Assessment & Plan   Secondary bacterial sinusitis:  Amoxicillin 500 mg 3 times daily for 7 days.   Push fluids Cool mist humidifier. Dayquil or Nightquil for cough and congestion.

## 2023-01-30 ENCOUNTER — Ambulatory Visit (INDEPENDENT_AMBULATORY_CARE_PROVIDER_SITE_OTHER): Payer: Self-pay | Admitting: Internal Medicine

## 2023-01-30 ENCOUNTER — Encounter: Payer: Self-pay | Admitting: Internal Medicine

## 2023-01-30 VITALS — BP 104/64 | HR 68 | Resp 16 | Ht 62.75 in | Wt 157.0 lb

## 2023-01-30 DIAGNOSIS — H1031 Unspecified acute conjunctivitis, right eye: Secondary | ICD-10-CM

## 2023-01-30 MED ORDER — OFLOXACIN 0.3 % OP SOLN: 1.0000 [drp] | Freq: Four times a day (QID) | OPHTHALMIC | 0 refills | Status: DC

## 2023-01-30 NOTE — Progress Notes (Signed)
       Subjective:    Patient ID: Kathleen Frey, female   DOB: [birth year], 27 y.o.   MRN: [id number]   HPI  Mariah Milling interprets   Right eye burning, itching and redness for 2 days.  This morning, awakened with eyelids crusted shut.   May have had mild throat discomfort the first day when she was outside, but nothing else.  No runny nose, congestion, cough.  No fever.  None of her children nor her husband have similar symptoms.  Current Meds  Medication Sig   levonorgestrel (MIRENA) 20 MCG/DAY IUD 1 each by Intrauterine route once.   pantoprazole (PROTONIX) 40 MG tablet 1 tab by mouth daily on empty stomach with water.   No Known Allergies   Review of Systems    Objective:   BP 104/64 (BP Location: Left Arm, Patient Position: Sitting, Cuff Size: Normal)   Pulse 68   Resp 16   Ht 5' 2.75" (1.594 m)   Wt 157 lb (71.2 kg)   BMI 28.03 kg/m   Physical Exam NAD HEENT:  PERRL, EOMI, right conjunctivae red with watery discharge.  Crusting along lash lines.  TMs pearly gray, throate without  injection. Neck:  supple, No adenopathy Chest;  CTA CV:  RRR without murmur or rub.  Radial pulses normal and equal   Assessment & Plan   Right acute conjunctivitis:  ocuflox 1 drop right eye 4 times daily for 5-7 days.  Warm water wash with clean wash cloth.  Good hand cleaning.  Good handwashing.

## 2023-02-28 ENCOUNTER — Other Ambulatory Visit: Payer: Self-pay

## 2023-02-28 DIAGNOSIS — R319 Hematuria, unspecified: Secondary | ICD-10-CM

## 2023-02-28 DIAGNOSIS — R300 Dysuria: Secondary | ICD-10-CM

## 2023-02-28 LAB — POCT URINALYSIS DIPSTICK
Bilirubin, UA: NEGATIVE
Glucose, UA: NEGATIVE
Ketones, UA: NEGATIVE
Nitrite, UA: NEGATIVE
Protein, UA: NEGATIVE
Spec Grav, UA: 1.01 (ref 1.010–1.025)
Urobilinogen, UA: 0.2 E.U./dL
pH, UA: 7.5 (ref 5.0–8.0)

## 2023-02-28 MED ORDER — METRONIDAZOLE 500 MG PO TABS: 500.0000 mg | ORAL_TABLET | Freq: Two times a day (BID) | ORAL | 0 refills | Status: AC

## 2023-02-28 NOTE — Progress Notes (Signed)
Patient complains of dysuria, some inflammation, cramping, some yellowish discharge. Started about 8 days ago. Was told at the [facility] department that she had a yeast infection and was given fluconazole for 1 day. Medication did not help at all. Patient has IUD so has not had a period.   Metronidazole '500mg'$  twice daily for 7 days was sent to the pharmacy and patient was told not to consume any alcohol while on this medication. Urine will be sent for culture.

## 2023-03-01 NOTE — Addendum Note (Signed)
Addended by: Mariah Milling on: 03/01/2023 05:08 PM   Modules accepted: Level of Service

## 2023-03-02 LAB — URINE CULTURE

## 2023-03-03 ENCOUNTER — Other Ambulatory Visit: Payer: Self-pay | Admitting: Psychology

## 2023-03-07 ENCOUNTER — Other Ambulatory Visit: Payer: Self-pay | Admitting: Psychology

## 2023-05-17 ENCOUNTER — Other Ambulatory Visit: Payer: Self-pay

## 2023-05-17 MED ORDER — PANTOPRAZOLE SODIUM 40 MG PO TBEC: DELAYED_RELEASE_TABLET | ORAL | 8 refills | Status: DC

## 2023-09-05 ENCOUNTER — Telehealth: Payer: Self-pay

## 2023-09-05 NOTE — Telephone Encounter (Signed)
Patient needs a sooner CPE appointment . Appt was rescheduled. Patient is available any day before pm or after 3pm  We will call when there is a cancellation.

## 2023-09-06 ENCOUNTER — Encounter: Payer: No Typology Code available for payment source | Admitting: Internal Medicine

## 2023-10-10 NOTE — Telephone Encounter (Signed)
Patient would like an OV appointment for stomach pain, and reflux with all food.  Patient has had this for a long time but it comes and goes.  Patient is available any day .   We will call patient if there is a cancellation.

## 2023-10-11 NOTE — Telephone Encounter (Signed)
Offered patient an appointment , patient did not take appointment.

## 2023-10-18 ENCOUNTER — Encounter: Payer: Self-pay | Admitting: Internal Medicine

## 2023-10-18 ENCOUNTER — Ambulatory Visit: Payer: Self-pay | Admitting: Internal Medicine

## 2023-10-18 VITALS — BP 118/64 | HR 95 | Ht 62.75 in | Wt 160.0 lb

## 2023-10-18 DIAGNOSIS — J029 Acute pharyngitis, unspecified: Secondary | ICD-10-CM

## 2023-10-18 DIAGNOSIS — R1013 Epigastric pain: Secondary | ICD-10-CM

## 2023-10-18 DIAGNOSIS — K219 Gastro-esophageal reflux disease without esophagitis: Secondary | ICD-10-CM

## 2023-10-18 DIAGNOSIS — J069 Acute upper respiratory infection, unspecified: Secondary | ICD-10-CM

## 2023-10-18 LAB — POC COVID19 BINAXNOW: SARS Coronavirus 2 Ag: NEGATIVE

## 2023-10-18 LAB — POCT INFLUENZA A/B
Influenza A, POC: NEGATIVE
Influenza B, POC: NEGATIVE

## 2023-10-18 MED ORDER — VICKS NYQUIL COLD & FLU NIGHT 30-650-4 MG/30ML PO LIQD: ORAL | Status: DC

## 2023-10-18 MED ORDER — PANTOPRAZOLE SODIUM 40 MG PO TBEC: DELAYED_RELEASE_TABLET | ORAL | 4 refills | Status: DC

## 2023-10-18 NOTE — Progress Notes (Signed)
    Subjective:    Patient ID: Kathleen Frey, female   DOB: [birth year], 27 y.o.   MRN: [id number]   HPI  Duayne Cal interprets  Epigastric pain with nausea after eating.  Pinching pain in nature.  No vomiting.  She at times has sense of need to have a BM, much like she will have diarrhea (cramping) after eating at times, but does not have diarrhea.   Sounds like she has constant discomfort and just worsens after eating.   Has had these episodes in past. Has multiple episodes in a year.   Episodes last about 3 days, but can last more than 1 month.   This episode started about 2 weeks ago. Has been on Pantoprazole 40 mg on empty stomach since 07/2020 for GERD and epigastric pain.  If she misses a day, she has more symptoms.   No melena or hematochezia.  No weight loss.   Current Meds  Medication Sig   levonorgestrel (MIRENA) 20 MCG/DAY IUD 1 each by Intrauterine route once.   pantoprazole (PROTONIX) 40 MG tablet 1 tab by mouth daily on empty stomach with water.   No Known Allergies   Review of Systems    Objective:   BP 118/64 (BP Location: Left Arm, Patient Position: Sitting, Cuff Size: Normal)   Pulse 95   Ht 5' 2.75" (1.594 m)   Wt 160 lb (72.6 kg)   BMI 28.57 kg/m   Physical Exam NAD Coughing HEENT:  PERRL, EOMI, TMs pearly gray, throat without injection.  Nasal mucosa with mild erythema and clear discharge Neck:  Supple, No adenopathy Chest:  CTA CV:  RRR without murmur or rub.  Radial pulses normal and equal Abd:  S, Mild epigastric tenderness.  No RUQ tenderness.  + BS.  No HSM or mass.    Assessment & Plan   Epigastric pain and GERD:  increase Pantoprazole to 40 mg twice daily on empty stomach   Referral to GI for EGD/H.  Pylori evaluation.    2.  URI:  negative influenza and COVID testing.  Supportive care.  Nyquil for bedtime.

## 2023-10-19 LAB — CBC WITH DIFFERENTIAL/PLATELET
Basophils Absolute: 0.1 10*3/uL (ref 0.0–0.2)
Basos: 1 %
EOS (ABSOLUTE): 0.2 10*3/uL (ref 0.0–0.4)
Eos: 2 %
Hematocrit: 38.7 % (ref 34.0–46.6)
Hemoglobin: 12.6 g/dL (ref 11.1–15.9)
Immature Grans (Abs): 0.1 10*3/uL (ref 0.0–0.1)
Immature Granulocytes: 1 %
Lymphocytes Absolute: 4.4 10*3/uL — ABNORMAL HIGH (ref 0.7–3.1)
Lymphs: 41 %
MCH: 29.2 pg (ref 26.6–33.0)
MCHC: 32.6 g/dL (ref 31.5–35.7)
MCV: 90 fL (ref 79–97)
Monocytes Absolute: 0.7 10*3/uL (ref 0.1–0.9)
Monocytes: 7 %
Neutrophils Absolute: 5.2 10*3/uL (ref 1.4–7.0)
Neutrophils: 48 %
Platelets: 351 10*3/uL (ref 150–450)
RBC: 4.31 x10E6/uL (ref 3.77–5.28)
RDW: 10.9 % — ABNORMAL LOW (ref 11.7–15.4)
WBC: 10.6 10*3/uL (ref 3.4–10.8)

## 2023-10-19 LAB — COMPREHENSIVE METABOLIC PANEL
ALT: 11 [IU]/L (ref 0–32)
AST: 15 [IU]/L (ref 0–40)
Albumin: 4.4 g/dL (ref 4.0–5.0)
Alkaline Phosphatase: 69 [IU]/L (ref 44–121)
BUN/Creatinine Ratio: 21 (ref 9–23)
BUN: 15 mg/dL (ref 6–20)
Bilirubin Total: 0.2 mg/dL (ref 0.0–1.2)
CO2: 22 mmol/L (ref 20–29)
Calcium: 9.9 mg/dL (ref 8.7–10.2)
Chloride: 102 mmol/L (ref 96–106)
Creatinine, Ser: 0.72 mg/dL (ref 0.57–1.00)
Globulin, Total: 3.1 g/dL (ref 1.5–4.5)
Glucose: 91 mg/dL (ref 70–99)
Potassium: 4.1 mmol/L (ref 3.5–5.2)
Sodium: 139 mmol/L (ref 134–144)
Total Protein: 7.5 g/dL (ref 6.0–8.5)
eGFR: 116 mL/min/{1.73_m2} (ref >59)

## 2024-02-08 ENCOUNTER — Telehealth: Payer: Self-pay

## 2024-02-08 ENCOUNTER — Ambulatory Visit: Payer: No Typology Code available for payment source | Admitting: Internal Medicine

## 2024-02-08 ENCOUNTER — Encounter: Payer: Self-pay | Admitting: Internal Medicine

## 2024-02-08 VITALS — BP 120/70 | HR 86 | Ht 62.75 in | Wt 158.5 lb

## 2024-02-08 DIAGNOSIS — Z23 Encounter for immunization: Secondary | ICD-10-CM

## 2024-02-08 DIAGNOSIS — B009 Herpesviral infection, unspecified: Secondary | ICD-10-CM

## 2024-02-08 DIAGNOSIS — Z0000 Encounter for general adult medical examination without abnormal findings: Secondary | ICD-10-CM

## 2024-02-08 MED ORDER — PANTOPRAZOLE SODIUM 40 MG PO TBEC: DELAYED_RELEASE_TABLET | ORAL | 11 refills | Status: AC

## 2024-02-08 MED ORDER — VALACYCLOVIR HCL 500 MG PO TABS: ORAL_TABLET | ORAL | 2 refills | Status: DC

## 2024-02-08 NOTE — Progress Notes (Signed)
 Kathleen Frey

## 2024-02-08 NOTE — Progress Notes (Signed)
Subjective:    Patient ID: Kathleen Frey, female   DOB: 11/03/1996, 28 y.o.   MRN: [id number]   HPI  CPE with pap  1.  Pap:  Last performed at [facility] 02/2022 and reportedly normal.  History of abnormal pap 6 years or more ago.    2.  Mammogram:  Never.  Maternal first cousin diagnosed at age 72.  Died from breast cancer.  Not sure if premenopausal.    3.  Osteoprevention:  Does not eat or drink much in way of milk products.  Not outside with activities much.  Does go to gym.    4.  Guaiac Cards/FIT:  Never.    5.  Colonoscopy:  Never.  No family history of colon cancer.    6.  Immunizations:  Has not had influenza nor COVID vaccination. Immunization History  Administered Date(s) Administered   [employer] Vaccination 04/25/2020, 05/23/2020   Tdap 01/07/2019     7.  Glucose/Cholesterol:  Glucoses fine in past Cholesterol fine in 2023 Lipid Panel     Component Value Date/Time   CHOL 166 09/16/2022 0912   TRIG 71 09/16/2022 0912   HDL 51 09/16/2022 0912   LDLCALC 101 (H) 09/16/2022 0912   LABVLDL 14 09/16/2022 0912     Current Meds  Medication Sig   levonorgestrel (MIRENA) 20 MCG/DAY IUD 1 each by Intrauterine route once.   pantoprazole (PROTONIX) 40 MG tablet 1 tab by mouth twice daily on empty stomach with water.   No Known Allergies Family History  Problem Relation Age of Onset   Hypertension Mother    Hyperlipidemia Mother    Alcohol abuse Father        complication of alcohol abuse--sounds like liver failure   Cerebrovascular Accident Sister        Cerebral Embolism:  not clear etiology per patient   Cancer Sister        Thyroid Cancer   Seizures Brother        Started at 6 months.  could not get him to hospital and so now wheelchair bound and developmental delay.   ADD / ADHD Son        Mom questions this diagnosis.   Social History   Socioeconomic History   Marital status: Media planner    Spouse name: Jeanmarie Hubert    Number of children: 3   Years of education: Not on file   Highest education level: 9th grade  Occupational History   Occupation: [employer] [employer]  Tobacco Use   Smoking status: Never    Passive exposure: Never   Smokeless tobacco: Never  Vaping Use   Vaping status: Never Used  Substance and Sexual Activity   Alcohol use: Yes    Comment: occasional   Drug use: No   Sexual activity: Yes    Birth control/protection: I.U.D.  Other Topics Concern   Not on file  Social History Narrative   Lives at home with long term boyfriend and her 3 children   Boyfriend is father of her youngest Therapist, music.   Social Drivers of Corporate investment banker Strain: Low Risk  (02/08/2024)   Overall Financial Resource Strain (CARDIA)    Difficulty of Paying Living Expenses: Not hard at all  Food Insecurity: No Food Insecurity (02/08/2024)   Hunger Vital Sign    Worried About Running Out of Food in the Last Year: Never true    Ran Out of Food in the Last Year: Never true  Transportation  Needs: No Transportation Needs (02/08/2024)   PRAPARE - Administrator, Civil Service (Medical): No    Lack of Transportation (Non-Medical): No  Physical Activity: Not on file  Stress: Stress Concern Present (02/27/2019)   [employer] of Occupational Health - Occupational Stress Questionnaire    Feeling of Stress : To some extent  Social Connections: Not on file  Intimate Partner Violence: Not At Risk (02/08/2024)   Humiliation, Afraid, Rape, and Kick questionnaire    Fear of Current or Ex-Partner: No    Emotionally Abused: No    Physically Abused: No    Sexually Abused: No    Past Medical History:  Diagnosis Date   GERD (gastroesophageal reflux disease)    HSV infection    GU   Pyelonephritis    Past Surgical History:  Procedure Laterality Date   CESAREAN SECTION  2012, 2015   CESAREAN SECTION N/A 03/07/2019   Procedure: CESAREAN SECTION;  Surgeon: Adam Phenix, MD;  Location: [facility] LD  ORS;  Service: Obstetrics;  Laterality: N/A;     Review of Systems  Genitourinary:        Has 2 GU outbreaks of Herpes in vulvar area per year.  Would like refills of Valtrex      Objective:   BP 120/70 (BP Location: Left Arm, Patient Position: Sitting, Cuff Size: Normal)   Pulse 86   Ht 5' 2.75" (1.594 m)   Wt 158 lb 8 oz (71.9 kg)   SpO2 99%   BMI 28.30 kg/m   Physical Exam Constitutional:      Appearance: Normal appearance.  HENT:     Head: Normocephalic and atraumatic.     Right Ear: Tympanic membrane, ear canal and external ear normal.     Left Ear: Tympanic membrane, ear canal and external ear normal.     Nose: Nose normal.     Mouth/Throat:     Mouth: Mucous membranes are moist.     Pharynx: Oropharynx is clear.  Eyes:     Extraocular Movements: Extraocular movements intact.     Conjunctiva/sclera: Conjunctivae normal.     Pupils: Pupils are equal, round, and reactive to light.     Comments: Discs sharp  Neck:     Thyroid: No thyroid mass or thyromegaly.  Cardiovascular:     Rate and Rhythm: Normal rate and regular rhythm.     Pulses: Normal pulses.     Heart sounds: S1 normal and S2 normal. No murmur heard.    No friction rub. No S3 or S4 sounds.  Pulmonary:     Effort: Pulmonary effort is normal.     Breath sounds: Normal breath sounds and air entry.  Chest:  Breasts:    Right: No inverted nipple, mass or nipple discharge.     Left: No inverted nipple, mass or nipple discharge.  Abdominal:     General: Bowel sounds are normal.     Palpations: Abdomen is soft. There is no hepatomegaly, splenomegaly or mass.     Tenderness: There is no abdominal tenderness.     Hernia: No hernia is present.     Comments: Extensive striae of abdominal skin. Well healed midline surgical scar to umbilicus  Genitourinary:    Comments: Deferred until have records from [facility] Musculoskeletal:        General: Normal range of motion.     Cervical back: Normal range of  motion and neck supple.     Right lower leg: No edema.  Left lower leg: No edema.  Lymphadenopathy:     Head:     Right side of head: No submental or submandibular adenopathy.     Left side of head: No submental or submandibular adenopathy.     Cervical: No cervical adenopathy.     Upper Body:     Right upper body: No supraclavicular or axillary adenopathy.     Left upper body: No supraclavicular or axillary adenopathy.     Lower Body: No right inguinal adenopathy. No left inguinal adenopathy.  Skin:    General: Skin is warm.     Capillary Refill: Capillary refill takes less than 2 seconds.     Findings: No rash.  Neurological:     General: No focal deficit present.     Mental Status: She is alert and oriented to person, place, and time.     Cranial Nerves: Cranial nerves 2-12 are intact.     Sensory: Sensation is intact.     Motor: Motor function is intact.     Coordination: Coordination is intact.     Gait: Gait is intact.     Deep Tendon Reflexes: Reflexes are normal and symmetric.  Psychiatric:        Mood and Affect: Mood normal.        Speech: Speech normal.        Behavior: Behavior normal. Behavior is cooperative.      Assessment & Plan   CPE Send for pap results and dates from [facility] If due in March, will have her return for that. Influenza vaccine Labs in October fine. Encouraged her to obtain COVID vaccine with [facility] mobile vaccination clinics

## 2024-02-08 NOTE — Telephone Encounter (Signed)
Per Dr. Delrae Alfred request, patient to be on wait list for an appointment for Pap and pelvic exam by the end of march.   We will call patient if there is a cancellation.

## 2024-02-21 ENCOUNTER — Encounter: Payer: Self-pay | Admitting: Gastroenterology

## 2024-02-21 ENCOUNTER — Ambulatory Visit (INDEPENDENT_AMBULATORY_CARE_PROVIDER_SITE_OTHER): Payer: Self-pay | Admitting: Gastroenterology

## 2024-02-21 VITALS — BP 110/70 | HR 87 | Ht 62.75 in | Wt 157.0 lb

## 2024-02-21 DIAGNOSIS — K5900 Constipation, unspecified: Secondary | ICD-10-CM | POA: Diagnosis not present

## 2024-02-21 DIAGNOSIS — K219 Gastro-esophageal reflux disease without esophagitis: Secondary | ICD-10-CM | POA: Diagnosis not present

## 2024-02-21 DIAGNOSIS — R1013 Epigastric pain: Secondary | ICD-10-CM

## 2024-02-21 MED ORDER — FAMOTIDINE 40 MG PO TABS: 40.0000 mg | ORAL_TABLET | Freq: Two times a day (BID) | ORAL | 3 refills | Status: AC

## 2024-02-21 NOTE — Patient Instructions (Signed)
 We have sent the following medications to your pharmacy for you to pick up at your convenience: Pepcid 40 mg twice daily.  Your provider has ordered "Diatherix" stool testing for you. You have received a kit from our office today containing all necessary supplies to complete this test. Please carefully read the stool collection instructions provided in the kit before opening the accompanying materials. In addition, be sure there is a label providing your full name and date of birth on the "puritan opti-swab" tube that is supplied in the kit (if you do not see a label with this information on your test tube, please make [place] aware before test collection!). After completing the test, you should secure the purtian tube into the specimen biohazard bag. The [facility] E-Req sheet (including date and time of specimen collection) should be placed into the outside pocket of the specimen biohazard bag and returned to the [place] lab (basement floor of [facility]) within 3 days of collection. Please make sure to give the specimen to a staff member at the lab. DO NOT leave the specimen on the counter.   If the specimen date and time (can be found in the upper right boxed portion of the sheet) are not filled out on the E-Req sheet, the test will NOT be performed.    _______________________________________________________  If your blood pressure at your visit was 140/90 or greater, please contact your primary care physician to follow up on this.  _______________________________________________________  If you are age 30 or older, your body mass index should be between 23-30. Your Body mass index is 28.03 kg/m. If this is out of the aforementioned range listed, please consider follow up with your Primary Care Provider.  If you are age 105 or younger, your body mass index should be between 19-25. Your Body mass index is 28.03 kg/m. If this is out of the aformentioned range listed,  please consider follow up with your Primary Care Provider.   ________________________________________________________  The [facility] providers would like to encourage you to use [facility] to communicate with providers for non-urgent requests or questions.  Due to long hold times on the telephone, sending your provider a message by [facility] may be a faster and more efficient way to get a response.  Please allow 48 business hours for a response.  Please remember that this is for non-urgent requests.  _______________________________________________________    It was a pleasure to see you today!  Thank you for trusting me with your gastrointestinal care!    Scott E.Tomasa Rand, MD

## 2024-02-21 NOTE — Progress Notes (Signed)
 HPI : Kathleen Frey is a 28 year old female who presents with symptoms of acid reflux.  She primarily speaks Spanish, and so is accompanied by a [place] language interpreter in the office today.  She has experienced symptoms of acid reflux for several years, describing them as a burning sensation that comes and goes, sometimes reaching her throat. The symptoms are not constant but occur almost daily if she does not take her medication. No difficulty swallowing, nausea or vomiting.  She denies waking up from reflux symptoms.  She has been taking pantoprazole (Protonix) for approximately three years, which has helped reduce the frequency of her symptoms. She previously tried omeprazole, which was not effective, leading to a switch to pantoprazole. She takes pantoprazole once daily in the morning.  Certain foods exacerbate her symptoms, including spicy foods, fatty foods, and coffee. She has not noticed any recent changes in her weight.  She is also experienced episodic epigastric pain for many years.  This pain has improved since starting pantoprazole as well.  She reports occasional constipation, with bowel movements sometimes occurring once a day or skipping a day.    Past Medical History:  Diagnosis Date   GERD (gastroesophageal reflux disease)    HSV infection    GU   Pyelonephritis      Past Surgical History:  Procedure Laterality Date   CESAREAN SECTION  2012, 2015   CESAREAN SECTION N/A 03/07/2019   Procedure: CESAREAN SECTION;  Surgeon: Adam Phenix, MD;  Location: [facility];  Service: Obstetrics;  Laterality: N/A;   Family History  Problem Relation Age of Onset   Hypertension Mother    Hyperlipidemia Mother    Alcohol abuse Father        complication of alcohol abuse--sounds like liver failure   Cerebrovascular Accident Sister        Cerebral Embolism:  not clear etiology per patient   Cancer Sister        Thyroid Cancer   Seizures Brother        Started  at 6 months.  could not get him to hospital and so now wheelchair bound and developmental delay.   ADD / ADHD Son        Mom questions this diagnosis.   Social History   Tobacco Use   Smoking status: Never    Passive exposure: Never   Smokeless tobacco: Never  Vaping Use   Vaping status: Never Used  Substance Use Topics   Alcohol use: Yes    Comment: occasional   Drug use: No   Current Outpatient Medications  Medication Sig Dispense Refill   levonorgestrel (MIRENA) 20 MCG/DAY IUD 1 each by Intrauterine route once.     pantoprazole (PROTONIX) 40 MG tablet 1 tab by mouth once daily on empty stomach with water. 30 tablet 11   valACYclovir (VALTREX) 500 MG tablet 1 tab by mouth twice daily for 3 days as needed 6 tablet 2   No current facility-administered medications for this visit.   No Known Allergies   Review of Systems: All systems reviewed and negative except where noted in HPI.    No results found.  Physical Exam: BP 110/70   Pulse 87   Ht 5' 2.75" (1.594 m)   Wt 157 lb (71.2 kg)   LMP 01/20/2024 (Approximate) Comment: Spots one day since IUD placed  BMI 28.03 kg/m  Constitutional: Pleasant,well-developed, Hispanic female in no acute distress.  Accompanied by Spanish language interpreter HEENT: Normocephalic and atraumatic.  Conjunctivae are normal. No scleral icterus. Neck supple.  Cardiovascular: Normal rate, regular rhythm.  Pulmonary/chest: Effort normal and breath sounds normal. No wheezing, rales or rhonchi. Abdominal: Soft, nondistended, nontender. Bowel sounds active throughout. There are no masses palpable. No hepatomegaly. Extremities: no edema Lymphadenopathy: No cervical adenopathy noted. Neurological: Alert and oriented to person place and time. Skin: Skin is warm and dry. No rashes noted. Psychiatric: Normal mood and affect. Behavior is normal.  CBC    Component Value Date/Time   WBC 10.6 10/18/2023 1821   WBC 11.5 (H) 03/08/2019 0616   RBC  4.31 10/18/2023 1821   RBC 3.24 (L) 03/08/2019 0616   HGB 12.6 10/18/2023 1821   HCT 38.7 10/18/2023 1821   PLT 351 10/18/2023 1821   MCV 90 10/18/2023 1821   MCH 29.2 10/18/2023 1821   MCH 29.6 03/08/2019 0616   MCHC 32.6 10/18/2023 1821   MCHC 34.4 03/08/2019 0616   RDW 10.9 (L) 10/18/2023 1821   LYMPHSABS 4.4 (H) 10/18/2023 1821   MONOABS 0.6 11/19/2017 0641   EOSABS 0.2 10/18/2023 1821   BASOSABS 0.1 10/18/2023 1821    CMP     Component Value Date/Time   NA 139 10/18/2023 1821   K 4.1 10/18/2023 1821   CL 102 10/18/2023 1821   CO2 22 10/18/2023 1821   GLUCOSE 91 10/18/2023 1821   GLUCOSE 114 (H) 11/20/2017 0047   BUN 15 10/18/2023 1821   CREATININE 0.72 10/18/2023 1821   CALCIUM 9.9 10/18/2023 1821   PROT 7.5 10/18/2023 1821   ALBUMIN 4.4 10/18/2023 1821   AST 15 10/18/2023 1821   ALT 11 10/18/2023 1821   ALKPHOS 69 10/18/2023 1821   BILITOT 0.2 10/18/2023 1821   GFRNONAA 129 07/30/2020 1223   GFRAA 149 07/30/2020 1223       Latest Ref Rng & Units 10/18/2023    6:21 PM 09/16/2022    9:12 AM 07/30/2020   12:23 PM  CBC EXTENDED  WBC 3.4 - 10.8 x10E3/uL 10.6  7.4  7.7   RBC 3.77 - 5.28 x10E6/uL 4.31  4.47  4.78   Hemoglobin 11.1 - 15.9 g/dL 78.2  95.6  21.3   HCT 34.0 - 46.6 % 38.7  39.9  43.6   Platelets 150 - 450 x10E3/uL 351  285  282   NEUT# 1.4 - 7.0 x10E3/uL 5.2  4.7  4.9   Lymph# 0.7 - 3.1 x10E3/uL 4.4  2.2  2.2       ASSESSMENT AND PLAN:  28 year old female with chronic typical GERD symptoms, currently well-controlled with once daily pantoprazole  Gastroesophageal Reflux Disease (GERD) Chronic GERD with symptoms of retrosternal burning and acid regurgitation, exacerbated by fatty, spicy, tomato-based foods, and coffee. Symptoms are well-controlled with pantoprazole but recur without it.  We discussed the pathophysiology of GERD and the principles of GERD management to include lifestyle modifications  such as dietary discretion (avoidance of  alcohol, tobacco, caffeinated and carbonated beverages, spicy/greasy foods, citrus, peppermint/chocolate), weight loss if applicable, head of bed elevation andconsuming last meal of day within 3 hours of bedtime; pharmacologic options to include PPIs, H2RAs and OTC antacids; and finally surgical or endoscopic fundoplication. I have reviewed the indications, risks, and benefits of PPI therapy with the patient today. I have discussed studies that suggest an increased risk of osteoporosis/pathologic fracture and kidney disease.  I also discussed studies suggesting other potential adverse consequences such as increased risk of dementia, CAD and CVA and explained that these studies show very weak associations  of unclear significance and not clear cause and effect. We did discuss the potential for vitamin malabsorption, to include magnesium (very rare), calcium (easily modifiable with Calcium Citrate supplement), vitamin B12 (again, correctable with oral B12 supplement), and iron (although rarely clinically significant outside patients who require iron supplementation previously), and can monitor each of these periodically with routine labs.  As she has not tried taking an H2RA agent, I recommended switching to Pepcid to mitigate long-term risks. If Pepcid is ineffective at controlling symptoms, continuation of pantoprazole with dietary modifications is advised. - Switch from pantoprazole to Pepcid 40 mg twice daily. - If symptoms are controlled with twice daily Pepcid, reduce to once daily. - Discuss dietary modifications to avoid trigger foods: fatty, spicy, tomato-based foods, coffee, carbonated beverages, peppermint, chocolate, and alcohol.   Epigastric pain Potential Helicobacter pylori infection due to its association with chronic abdominal pain and increased risk of gastric cancer and ulcers. Testing is warranted to rule out this common bacterial infection. - Order Diatherix stool test for Helicobacter  pylori.  Constipation Intermittent constipation with bowel movements once daily or every other day. Discussed benefits of daily fiber supplementation to increase stool bulk and water content, facilitating easier passage and regularity. - Recommend daily fiber supplement to improve bowel regularity.     Hanadi Stanly E. Tomasa Rand, MD [facility]  I spent a total of 45 minutes reviewing the patient's medical record, interviewing and examining the patient, discussing her diagnosis and management of her condition going forward, and documenting in the medical record    Julieanne Manson, MD

## 2024-03-11 ENCOUNTER — Other Ambulatory Visit: Payer: Self-pay | Admitting: Internal Medicine

## 2024-03-11 ENCOUNTER — Ambulatory Visit: Admitting: Internal Medicine

## 2024-03-11 ENCOUNTER — Encounter: Payer: Self-pay | Admitting: Internal Medicine

## 2024-03-11 VITALS — BP 106/72 | HR 70 | Resp 16 | Ht 62.0 in | Wt 157.0 lb

## 2024-03-11 DIAGNOSIS — Z124 Encounter for screening for malignant neoplasm of cervix: Secondary | ICD-10-CM

## 2024-03-11 DIAGNOSIS — K219 Gastro-esophageal reflux disease without esophagitis: Secondary | ICD-10-CM

## 2024-03-11 NOTE — Telephone Encounter (Signed)
 Patient has been scheduled

## 2024-03-11 NOTE — Progress Notes (Signed)
    Subjective:    Patient ID: Kathleen Frey, female   DOB: [birth year], 28 y.o.   MRN: [id number]   HPI  Here today to complete CPE --needs only pap and pelvic exam.  No vaginal discharge  She was also seen recently by GI.  Checking stool antigen for H. Pylori and switching her to H2blocker, Famotidine 40 mg twice daily with plant to wean to once daily.  She has not yet returned the stool sample for testing.    Current Meds  Medication Sig   famotidine (PEPCID) 40 MG tablet Take 1 tablet (40 mg total) by mouth 2 (two) times daily.   levonorgestrel (MIRENA) 20 MCG/DAY IUD 1 each by Intrauterine route once.    No Known Allergies   Review of Systems    Objective:   BP 106/72 (BP Location: Left Arm, Patient Position: Sitting, Cuff Size: Normal)   Pulse 70   Resp 16   Ht 5\' 2"  (1.575 m)   Wt 157 lb (71.2 kg)   SpO2 99%   BMI 28.72 kg/m   Physical Exam NAD GU:  Normal external female genitalia no vaginal discharge.   No cervical lesions. No uterine or adnexal mass or tenderness    Assessment & Plan    Completion of CPE with pap woith HPV testing and normal pelvic.    2.  GERD:  Now taking Famotidine 40 mg twice daily, to wean eventually to once daily.  Encouraged her to get her stool sample in for H. Pylori.

## 2024-03-13 LAB — CYTOLOGY - PAP

## 2024-06-10 ENCOUNTER — Other Ambulatory Visit

## 2024-06-10 ENCOUNTER — Telehealth: Payer: Self-pay | Admitting: Internal Medicine

## 2024-06-10 DIAGNOSIS — B9689 Other specified bacterial agents as the cause of diseases classified elsewhere: Secondary | ICD-10-CM

## 2024-06-10 DIAGNOSIS — N760 Acute vaginitis: Secondary | ICD-10-CM

## 2024-06-10 LAB — POCT WET PREP WITH KOH
KOH Prep POC: NEGATIVE
RBC Wet Prep HPF POC: NEGATIVE
Trichomonas, UA: NEGATIVE
Yeast Wet Prep HPF POC: NEGATIVE

## 2024-06-10 MED ORDER — METRONIDAZOLE 500 MG PO TABS
500.0000 mg | ORAL_TABLET | Freq: Two times a day (BID) | ORAL | 0 refills | Status: AC
Start: 2024-06-10 — End: 2024-06-17

## 2024-06-10 NOTE — Telephone Encounter (Signed)
 Patient needs an appointment for  Patient is having itchiness in vagina, itchiness feels on  the outside ,  Patient is having yellow discharge with odor . Symptoms started 4 days ago. No other symptoms like fever.   Patient has not changed partners, patient states she does not think her partner has been with another person.  Patient states she  had bacterial vaginosis In the past  , and  states symptoms feel the same .  Patient will be coming at 3:30 pm to do a self swab.

## 2024-06-10 NOTE — Progress Notes (Signed)
 Reported yellow vaginal discharge with odor and itching for 2 days. Stable female partner.  Wet prep with clue cells and + whiff

## 2024-06-11 NOTE — Telephone Encounter (Signed)
  Patient informed today that Metronidazole  was sent to [facility] on [facility]- given medication instructions below.   metroNIDAZOLE  (FLAGYL ) 500 MG tablet [[id number]]   Order Details  Dose: 500 mg Route: Oral Frequency: 2 times daily Dispense Quantity: 14 tablet (7 day supply) Refills: 0  Duration: 7 days Dispense As Written: No

## 2024-08-12 ENCOUNTER — Telehealth: Payer: Self-pay | Admitting: Internal Medicine

## 2024-08-12 NOTE — Telephone Encounter (Signed)
 Patient would like an appointment for patient states that her index finger of right hand is swollen. Patient states she did not injure finger .  Patient states she just noticed her finger swollen with stabbing pain at first, this started a week ago,  Patient states pain remains there and continues to be swollen patient states she now sees her finger with purple color .    We will call patient if there is a cancellation.

## 2024-08-13 NOTE — Telephone Encounter (Signed)
 Patient has been scheduled

## 2024-08-14 ENCOUNTER — Ambulatory Visit (INDEPENDENT_AMBULATORY_CARE_PROVIDER_SITE_OTHER): Admitting: Internal Medicine

## 2024-08-14 VITALS — BP 100/77 | HR 84 | Resp 18 | Ht 62.0 in | Wt 157.0 lb

## 2024-08-14 DIAGNOSIS — L03011 Cellulitis of right finger: Secondary | ICD-10-CM

## 2024-08-14 NOTE — Progress Notes (Signed)
 Established patient visit      Patient: Kathleen Frey   DOB: 12-01-1996   28 y.o. Female  MRN: [id number] Visit Date: 08/14/2024  Today's healthcare provider: Almarie Bolds, MD  Subjective:    Chief Complaint  Patient presents with   Joint Swelling    Right index finger swollen for 8 days   Rn note reviewed.   Interpretor Erminio Bloomer present for history, exam, and  decision making and discharge instructions. History and physical initially by me, doc in training under Dr. Bolds.  Then pt was presented to Dr. Bolds.  She did history and exam and formulated final A/P with me and patient.  Discharge instructions given by Dr. Bolds using interpretor.   8 days ago started feeling occasional intermittent stabbing pain on the right index finger, around the nail.   It was occasional. 4 Days ago, it became constant, more intense red, hot.  Looked it up on the internet which advised to soak it in warm water.   She has been soaking it in warm water and it has improved greatly.   On the day she made the appt. It was really bad but now it is much better. No discharge.   3 weeks ago, she put on acrylic nails.   Removed them 4 days ago.   Also was washing the bottom metal part of a blender about 2 weeks ago.  Thinks she might have stabbed her finger but not sure.   No cuts.  Says it was 3 times what it is today in terms of swelling.   It was hurting all the time but now it hurts only when she pushes on it.          Medications: Outpatient Medications Prior to Visit  Medication Sig   famotidine  (PEPCID ) 40 MG tablet Take 1 tablet (40 mg total) by mouth 2 (two) times daily.   levonorgestrel (MIRENA) 20 MCG/DAY IUD 1 each by Intrauterine route once.   pantoprazole  (PROTONIX ) 40 MG tablet 1 tab by mouth once daily on empty stomach with water. (Patient not taking: Reported on 03/11/2024)   valACYclovir  (VALTREX ) 500 MG tablet 1 tab by mouth twice daily for 3 days as needed  (Patient not taking: Reported on 03/11/2024)   No facility-administered medications prior to visit.    Review of Systems  Musculoskeletal:  Negative for arthralgias and joint swelling.       No pain in the other fingers.    Hematological:        No cuts or bruises  Psychiatric/Behavioral:  Positive for agitation.        Slight due to pain        Objective:    BP 100/77 (BP Location: Left Arm, Patient Position: Sitting, Cuff Size: Normal)   Pulse 84   Resp 18   Ht 5' 2 (1.575 m)   Wt 157 lb (71.2 kg)   LMP 08/12/2024 (Exact Date)   BMI 28.72 kg/m   Vitals:   08/14/24 1209  BP: 100/77  Pulse: 84  Resp: 18    Physical Exam Constitutional:      General: She is not in acute distress.    Appearance: Normal appearance. She is normal weight. She is not ill-appearing, toxic-appearing or diaphoretic.  HENT:     Head: Normocephalic and atraumatic.     Nose:     Comments: Appears normal Eyes:     General: No scleral icterus. Cardiovascular:     Comments: Finger  with good cap refill and normal warmth fo the finger Pulmonary:     Effort: Pulmonary effort is normal.  Musculoskeletal:     Comments: Right index finger with mild swelling on the lateral 12-4 o'clock area of the nail with mild tenderness but no fluctuance.  No drainage.  FROM at the dip joint,     Skin:    Capillary Refill: Capillary refill takes less than 2 seconds.     Comments: Temperature over the area similar to the other fingers.   Neurological:     General: No focal deficit present.     Mental Status: She is alert and oriented to person, place, and time.     Gait: Gait normal.     Comments: Intact sensation over the finger  Psychiatric:        Mood and Affect: Mood normal.        Behavior: Behavior normal.        Thought Content: Thought content normal.        Judgment: Judgment normal.      No results found for any visits on 08/14/24.    Assessment & Plan:     Paronychia of right index  finger Risks and benefits of sticking a needle into the area to see if there is more relief of pain versus continued soaking discussed with pt by Dr. Adella.   Since pain is getting remarkably better as is the swelling and redness and as there is no fluctuance but some swelling and minimal tenderness on exam, patient opted to continue soaks  bid and to avoid putting acrylic nails. RTC if not improving or worsening. Pt conveyed understanding.          Almarie Adella, MD  [facility] [phone] (phone) [phone] (fax)  [facility]

## 2024-08-14 NOTE — Assessment & Plan Note (Signed)
 Risks and benefits of sticking a needle into the area to see if there is more relief of pain versus continued soaking discussed with pt by Dr. Adella.   Since pain is getting remarkably better as is the swelling and redness and as there is no fluctuance but some swelling and minimal tenderness on exam, patient opted to continue soaks  bid and to avoid putting acrylic nails. RTC if not improving or worsening. Pt conveyed understanding.

## 2024-11-11 ENCOUNTER — Encounter: Payer: Self-pay | Admitting: Internal Medicine

## 2024-11-11 ENCOUNTER — Ambulatory Visit: Admitting: Internal Medicine

## 2024-11-11 VITALS — BP 130/80 | HR 65 | Resp 14 | Ht 62.0 in | Wt 155.0 lb

## 2024-11-11 DIAGNOSIS — M7918 Myalgia, other site: Secondary | ICD-10-CM

## 2024-11-11 DIAGNOSIS — R300 Dysuria: Secondary | ICD-10-CM

## 2024-11-11 LAB — POCT URINALYSIS DIPSTICK
Bilirubin, UA: NEGATIVE
Blood, UA: NEGATIVE
Glucose, UA: NEGATIVE
Ketones, UA: NEGATIVE
Leukocytes, UA: NEGATIVE
Nitrite, UA: NEGATIVE
Protein, UA: NEGATIVE
Spec Grav, UA: 1.01 (ref 1.010–1.025)
Urobilinogen, UA: 0.2 U/dL
pH, UA: 6.5 (ref 5.0–8.0)

## 2024-11-11 NOTE — Progress Notes (Unsigned)
    Subjective:    Patient ID: Kathleen Frey, female   DOB: 1996-07-06, 28 y.o.   MRN: [id number]   HPI  [place] [id number]  Left Lower back pain--actually points more so to gluteal area for several weeks.    This pain comes and goes.  Not clear related to urinary symptoms.  Poor historian She did once take 200 mg of Ibuprofen  with good relief of her back pain Is having urinary frequency and dark urine with odor.   Not with every episode of urination, but most.  Also for 3 weeks or more. Most prominent in morning when first gets up.   She has separate burning on urination in suprapubic area, but not in low back Drinks 6 to 8 twelve ounces of water daily.   No hematuria.   No vaginal discharge She is sexually active.   Urinary complaints may be worse after intercourse. She does urinary immediately after intercourse. She is with a stable female partner.   He has not had any symptoms from GU system.   No fever, nausea or vomiting.    Current Meds  Medication Sig   famotidine  (PEPCID ) 40 MG tablet Take 1 tablet (40 mg total) by mouth 2 (two) times daily.   levonorgestrel (MIRENA) 20 MCG/DAY IUD 1 each by Intrauterine route once.   valACYclovir  (VALTREX ) 500 MG tablet 1 tab by mouth twice daily for 3 days as needed (Patient taking differently: Take 500 mg by mouth as needed. 1 tab by mouth twice daily for 3 days as needed)   No Known Allergies   Review of Systems    Objective:   BP 130/80 (BP Location: Left Arm, Patient Position: Sitting, Cuff Size: Normal)   Pulse 65   Resp 14   Ht 5' 2 (1.575 m)   Wt 155 lb (70.3 kg)   LMP 09/27/2024 (Approximate)   BMI 28.35 kg/m   Physical Exam   Assessment & Plan

## 2024-11-11 NOTE — Progress Notes (Signed)
 Agree with evaluation and assessment and plan with Dr. Fleta

## 2024-11-14 LAB — URINE CULTURE

## 2024-11-20 ENCOUNTER — Ambulatory Visit: Payer: Self-pay | Admitting: Internal Medicine

## 2024-11-20 ENCOUNTER — Telehealth: Payer: Self-pay | Admitting: Internal Medicine

## 2024-11-20 MED ORDER — NITROFURANTOIN MONOHYD MACRO 100 MG PO CAPS: ORAL_CAPSULE | ORAL | 0 refills | Status: AC

## 2024-11-20 NOTE — Telephone Encounter (Signed)
 Patient is requesting a refill for medication     valACYclovir  (VALTREX ) 500 MG tablet    Please send refill to pharmacy  [facility] [address] ([address]),  - [address] [place] ([place]) [place] [address] Phone: [phone]  Fax: [phone]

## 2024-11-21 ENCOUNTER — Encounter: Payer: Self-pay | Admitting: Internal Medicine

## 2024-11-21 MED ORDER — VALACYCLOVIR HCL 500 MG PO TABS: ORAL_TABLET | ORAL | 2 refills | Status: AC

## 2024-11-22 NOTE — Telephone Encounter (Signed)
 Patient has been notified

## 2024-12-04 NOTE — Telephone Encounter (Signed)
 Patient called today stating she completed the Nitrofurantoin  to take twice daily for 7 days (11/20/2024 to 11/26/2024) and felt better while taking it but two days later she started having burning during urination and urge to go pee but only pees a small amount. No other symptoms reported.  Please advise

## 2025-03-07 ENCOUNTER — Other Ambulatory Visit

## 2025-03-13 ENCOUNTER — Encounter: Admitting: Internal Medicine
